# Patient Record
Sex: Male | Born: 1995 | Hispanic: Yes | Marital: Single | Smoking: Former smoker
Health system: Southern US, Community
[De-identification: ages and names within clinical notes are randomized; demographics above are authoritative.]

## PROBLEM LIST (undated history)

## (undated) DIAGNOSIS — K219 Gastro-esophageal reflux disease without esophagitis: Secondary | ICD-10-CM

## (undated) DIAGNOSIS — F419 Anxiety disorder, unspecified: Secondary | ICD-10-CM

## (undated) DIAGNOSIS — I10 Essential (primary) hypertension: Secondary | ICD-10-CM

## (undated) DIAGNOSIS — Z789 Other specified health status: Secondary | ICD-10-CM

## (undated) HISTORY — PX: ANTERIOR CRUCIATE LIGAMENT REPAIR: SHX115

## (undated) HISTORY — PX: TYMPANOSTOMY TUBE PLACEMENT: SHX32

## (undated) HISTORY — PX: CHOLECYSTECTOMY: SHX55

## (undated) HISTORY — PX: ADENOIDECTOMY: SUR15

## (undated) HISTORY — PX: TONSILLECTOMY: SUR1361

---

## 2019-12-30 ENCOUNTER — Other Ambulatory Visit: Payer: Self-pay

## 2019-12-30 ENCOUNTER — Emergency Department (HOSPITAL_BASED_OUTPATIENT_CLINIC_OR_DEPARTMENT_OTHER)
Admission: EM | Admit: 2019-12-30 | Discharge: 2019-12-30 | Disposition: A | Discharge status: Home / Self Care | Payer: Self-pay | Attending: Emergency Medicine | Admitting: Emergency Medicine

## 2019-12-30 ENCOUNTER — Encounter (HOSPITAL_BASED_OUTPATIENT_CLINIC_OR_DEPARTMENT_OTHER): Payer: Self-pay

## 2019-12-30 DIAGNOSIS — F1590 Other stimulant use, unspecified, uncomplicated: Secondary | ICD-10-CM | POA: Insufficient documentation

## 2019-12-30 DIAGNOSIS — Z87891 Personal history of nicotine dependence: Secondary | ICD-10-CM | POA: Insufficient documentation

## 2019-12-30 DIAGNOSIS — R1013 Epigastric pain: Secondary | ICD-10-CM | POA: Insufficient documentation

## 2019-12-30 LAB — CBC
HCT: 45.5 % (ref 39.0–52.0)
Hemoglobin: 15.3 g/dL (ref 13.0–17.0)
MCH: 28.7 pg (ref 26.0–34.0)
MCHC: 33.6 g/dL (ref 30.0–36.0)
MCV: 85.4 fL (ref 80.0–100.0)
Platelets: 228 10*3/uL (ref 150–400)
RBC: 5.33 MIL/uL (ref 4.22–5.81)
RDW: 12.7 % (ref 11.5–15.5)
WBC: 9.3 10*3/uL (ref 4.0–10.5)
nRBC: 0 % (ref 0.0–0.2)

## 2019-12-30 LAB — COMPREHENSIVE METABOLIC PANEL
ALT: 101 U/L — ABNORMAL HIGH (ref 0–44)
AST: 44 U/L — ABNORMAL HIGH (ref 15–41)
Albumin: 4.4 g/dL (ref 3.5–5.0)
Alkaline Phosphatase: 63 U/L (ref 38–126)
Anion gap: 9 (ref 5–15)
BUN: 13 mg/dL (ref 6–20)
CO2: 25 mmol/L (ref 22–32)
Calcium: 9.6 mg/dL (ref 8.9–10.3)
Chloride: 104 mmol/L (ref 98–111)
Creatinine, Ser: 0.81 mg/dL (ref 0.61–1.24)
GFR calc Af Amer: >60 mL/min (ref >60)
GFR calc non Af Amer: >60 mL/min (ref >60)
Glucose, Bld: 122 mg/dL — ABNORMAL HIGH (ref 70–99)
Potassium: 4.3 mmol/L (ref 3.5–5.1)
Sodium: 138 mmol/L (ref 135–145)
Total Bilirubin: 0.5 mg/dL (ref 0.3–1.2)
Total Protein: 7.1 g/dL (ref 6.5–8.1)

## 2019-12-30 LAB — LIPASE, BLOOD: Lipase: 23 U/L (ref 11–51)

## 2019-12-30 MED ORDER — FAMOTIDINE 20 MG PO TABS
20.0000 mg | ORAL_TABLET | Freq: Once | ORAL | Status: AC
  Administered 2019-12-30: 20 mg via ORAL
  Filled 2019-12-30: qty 1

## 2019-12-30 MED ORDER — ALUM & MAG HYDROXIDE-SIMETH 200-200-20 MG/5ML PO SUSP
30.0000 mL | Freq: Once | ORAL | Status: AC
  Administered 2019-12-30: 30 mL via ORAL
  Filled 2019-12-30: qty 30

## 2019-12-30 NOTE — ED Provider Notes (Signed)
[facility] EMERGENCY DEPARTMENT Provider Note   CSN: [id number] Arrival date & time: 12/30/19  0809     History Chief Complaint  Patient presents with  . Abdominal Pain    Leonard Clark is a 24 y.o. male.  Patient c/o epigastric pain for the past several months/years. Epigastric area, dull, moderate, non radiating. States pain is there whenever he wakes up in morning at normal time, states if he sleeps in late, he typically wont have them pain that day. Pain only in AM, for first 1-2 hours after awakening. No other specific exacerbating or alleviating factors. +reflux/heartburn. Denies radiation of pain or back pain. No fever or chills. Normal appetite. No wt loss. No hx pud, or pancreatitis. States remote hx cholecystectomy in his early teens, 'due to gb working only 15%'. No other abd surgery.   The history is provided by the patient and a parent.  Abdominal Pain Associated symptoms: no chest pain, no constipation, no cough, no diarrhea, no fever, no shortness of breath and no sore throat        History reviewed. No pertinent past medical history.  There are no problems to display for this patient.   Past Surgical History:  Procedure Laterality Date  . ADENOIDECTOMY    . ANTERIOR CRUCIATE LIGAMENT REPAIR    . CHOLECYSTECTOMY    . TONSILLECTOMY    . TYMPANOSTOMY TUBE PLACEMENT         No family history on file.  Social History   Tobacco Use  . Smoking status: Former Games developer  . Smokeless tobacco: Never Used  Vaping Use  . Vaping Use: Never used  Substance Use Topics  . Alcohol use: Never  . Drug use: Yes    Comment: delta 8 (marijuana) vape pen    Home Medications Prior to Admission medications   Not on File    Allergies    Other  Review of Systems   Review of Systems  Constitutional: Negative for fever.  HENT: Negative for sore throat and trouble swallowing.   Eyes: Negative for redness.  Respiratory: Negative for cough and shortness of  breath.   Cardiovascular: Negative for chest pain.  Gastrointestinal: Positive for abdominal pain. Negative for constipation and diarrhea.  Genitourinary: Negative for flank pain.  Musculoskeletal: Negative for back pain and neck pain.  Skin: Negative for rash.  Neurological: Negative for headaches.  Hematological: Does not bruise/bleed easily.  Psychiatric/Behavioral: Negative for confusion.    Physical Exam Updated Vital Signs BP 137/90 (BP Location: Right Arm)   Pulse 79   Temp 98.1 F (36.7 C) (Oral)   Resp 20   Ht 1.854 m (6\' 1" )   Wt (!) 161.5 kg   SpO2 98%   BMI 46.97 kg/m   Physical Exam Vitals and nursing note reviewed.  Constitutional:      Appearance: Normal appearance. He is well-developed.  HENT:     Head: Atraumatic.     Nose: Nose normal.     Mouth/Throat:     Mouth: Mucous membranes are moist.     Pharynx: Oropharynx is clear.  Eyes:     General: No scleral icterus.    Conjunctiva/sclera: Conjunctivae normal.  Neck:     Trachea: No tracheal deviation.  Cardiovascular:     Rate and Rhythm: Normal rate and regular rhythm.     Pulses: Normal pulses.     Heart sounds: Normal heart sounds. No murmur heard.  No friction rub. No gallop.   Pulmonary:  Effort: Pulmonary effort is normal. No accessory muscle usage or respiratory distress.     Breath sounds: Normal breath sounds.  Abdominal:     General: Bowel sounds are normal. There is no distension.     Palpations: Abdomen is soft. There is no mass.     Tenderness: There is no abdominal tenderness. There is no guarding or rebound.     Hernia: No hernia is present.     Comments: Obese.  Genitourinary:    Comments: No cva tenderness. Musculoskeletal:        General: No swelling or tenderness.     Cervical back: Normal range of motion and neck supple. No rigidity.  Skin:    General: Skin is warm and dry.     Findings: No rash.  Neurological:     Mental Status: He is alert.     Comments: Alert,  speech clear.   Psychiatric:        Mood and Affect: Mood normal.     ED Results / Procedures / Treatments   Labs (all labs ordered are listed, but only abnormal results are displayed) Results for orders placed or performed during the hospital encounter of 12/30/19  CBC  Result Value Ref Range   WBC 9.3 4.0 - 10.5 K/uL   RBC 5.33 4.22 - 5.81 MIL/uL   Hemoglobin 15.3 13.0 - 17.0 g/dL   HCT 85.6 39 - 52 %   MCV 85.4 80.0 - 100.0 fL   MCH 28.7 26.0 - 34.0 pg   MCHC 33.6 30.0 - 36.0 g/dL   RDW 31.4 97.0 - 26.3 %   Platelets 228 150 - 400 K/uL   nRBC 0.0 0.0 - 0.2 %  Comprehensive metabolic panel  Result Value Ref Range   Sodium 138 135 - 145 mmol/L   Potassium 4.3 3.5 - 5.1 mmol/L   Chloride 104 98 - 111 mmol/L   CO2 25 22 - 32 mmol/L   Glucose, Bld 122 (H) 70 - 99 mg/dL   BUN 13 6 - 20 mg/dL   Creatinine, Ser 7.85 0.61 - 1.24 mg/dL   Calcium 9.6 8.9 - 88.5 mg/dL   Total Protein 7.1 6.5 - 8.1 g/dL   Albumin 4.4 3.5 - 5.0 g/dL   AST 44 (H) 15 - 41 U/L   ALT 101 (H) 0 - 44 U/L   Alkaline Phosphatase 63 38 - 126 U/L   Total Bilirubin 0.5 0.3 - 1.2 mg/dL   GFR calc non Af Amer >60 >60 mL/min   GFR calc Af Amer >60 >60 mL/min   Anion gap 9 5 - 15  Lipase, blood  Result Value Ref Range   Lipase 23 11 - 51 U/L    EKG None  Radiology No results found.  Procedures Procedures (including critical care time)  Medications Ordered in ED Medications  famotidine (PEPCID) tablet 20 mg (has no administration in time range)  alum & mag hydroxide-simeth (MAALOX/MYLANTA) 200-200-20 MG/5ML suspension 30 mL (has no administration in time range)    ED Course  I have reviewed the triage vital signs and the nursing notes.  Pertinent labs & imaging results that were available during my care of the patient were reviewed by me and considered in my medical decision making (see chart for details).    MDM Rules/Calculators/A&P                          Labs sent. pepcid po, maalox  po.  Reviewed nursing notes and prior charts for additional history.   Labs reviewed/interpreted by me - lipase normal. Wbc normal. Sl elev ast/alt.  h pylori, send out lab, will remain pending.  Recheck abd soft nt.  Pt with symptoms for prolonged period without acute or abrupt change today or this week. Rec gi f/u.  Return precautions provided.    Final Clinical Impression(s) / ED Diagnoses Final diagnoses:  None    Rx / DC Orders ED Discharge Orders    None       Cathren Laine, MD 12/30/19 1104

## 2019-12-30 NOTE — ED Triage Notes (Signed)
Pt presents with abd pain and vomiting that is present in the AM and is better in the afternoon. Symptoms x 5 years. Today pt reports he had coffee ground emesis.

## 2019-12-30 NOTE — Discharge Instructions (Addendum)
It was our pleasure to provide your ER care today - we hope that you feel better.  Take protonix (acid blocker medication). You may also try pepcid or maalox for symptom relief.  Follow up with gi doctor in the next 1-2 weeks -  call office to arrange appointment.   From today's labs, a couple of your liver function tests were slightly elevated (AST 44, ALT 101) - follow up with your doctor.   Return to ER if worse, new symptoms, fevers, worsening or severe pain, persistent vomiting, or other concern.

## 2019-12-31 LAB — H. PYLORI ANTIBODY, IGG: H Pylori IgG: 0.16 Index Value (ref 0.00–0.79)

## 2020-01-26 ENCOUNTER — Other Ambulatory Visit: Payer: Self-pay

## 2020-01-26 ENCOUNTER — Encounter (HOSPITAL_BASED_OUTPATIENT_CLINIC_OR_DEPARTMENT_OTHER): Payer: Self-pay

## 2020-01-26 DIAGNOSIS — Z87891 Personal history of nicotine dependence: Secondary | ICD-10-CM | POA: Insufficient documentation

## 2020-01-26 DIAGNOSIS — Z20822 Contact with and (suspected) exposure to covid-19: Secondary | ICD-10-CM | POA: Insufficient documentation

## 2020-01-26 DIAGNOSIS — R1110 Vomiting, unspecified: Secondary | ICD-10-CM | POA: Insufficient documentation

## 2020-01-26 DIAGNOSIS — R5383 Other fatigue: Secondary | ICD-10-CM | POA: Diagnosis present

## 2020-01-26 LAB — SARS CORONAVIRUS 2 BY RT PCR (HOSPITAL ORDER, PERFORMED IN ~~LOC~~ HOSPITAL LAB): SARS Coronavirus 2: NEGATIVE

## 2020-01-26 NOTE — ED Triage Notes (Signed)
Pt c/o flu like sx started 8/14-states he has to have a covid test to return to [facility] gait

## 2020-01-27 ENCOUNTER — Emergency Department (HOSPITAL_BASED_OUTPATIENT_CLINIC_OR_DEPARTMENT_OTHER)
Admission: EM | Admit: 2020-01-27 | Discharge: 2020-01-27 | Disposition: A | Discharge status: Home / Self Care | Payer: HRSA Program | Attending: Emergency Medicine | Admitting: Emergency Medicine

## 2020-01-27 DIAGNOSIS — R1110 Vomiting, unspecified: Secondary | ICD-10-CM

## 2020-01-27 DIAGNOSIS — Z20822 Contact with and (suspected) exposure to covid-19: Secondary | ICD-10-CM

## 2020-01-27 MED ORDER — ONDANSETRON 8 MG PO TBDP: 8.0000 mg | ORAL_TABLET | Freq: Three times a day (TID) | ORAL | 1 refills | Status: DC | PRN

## 2020-01-27 NOTE — ED Provider Notes (Signed)
[facility] Provider Note: Leonard Dell, MD, FACEP  CSN: [id number] MRN: [id number] ARRIVAL: 01/26/20 at 1913 ROOM: MHFT2/[facility]FT2   CHIEF COMPLAINT  Fatigue   HISTORY OF PRESENT ILLNESS  01/27/20 12:53 AM Leonard Clark is a 24 y.o. male with 4 days of nasal congestion, cough and fatigue.  Symptoms are mild to moderate.  He was told he needed a Covid test before he could return to work.  He has also been having abdominal pain, worse in the morning.  He was recently placed on Prilosec with significant improvement in the heartburn but he continues to have nausea and vomiting in the mornings.   History reviewed. No pertinent past medical history.  Past Surgical History:  Procedure Laterality Date  . ADENOIDECTOMY    . ANTERIOR CRUCIATE LIGAMENT REPAIR    . CHOLECYSTECTOMY    . TONSILLECTOMY    . TYMPANOSTOMY TUBE PLACEMENT      No family history on file.  Social History   Tobacco Use  . Smoking status: Former Games developer  . Smokeless tobacco: Never Used  Vaping Use  . Vaping Use: Every day  Substance Use Topics  . Alcohol use: Never  . Drug use: Not Currently    Prior to Admission medications   Medication Sig Start Date End Date Taking? Authorizing Provider  ondansetron (ZOFRAN ODT) 8 MG disintegrating tablet Take 1 tablet (8 mg total) by mouth every 8 (eight) hours as needed for nausea or vomiting. 01/27/20   Joseth Weigel, MD    Allergies Other   REVIEW OF SYSTEMS  Negative except as noted here or in the History of Present Illness.   PHYSICAL EXAMINATION  Initial Vital Signs Blood pressure (!) 124/96, pulse 75, temperature 98.5 F (36.9 C), temperature source Oral, resp. rate 14, height 6\' 1"  (1.854 m), weight (!) 162.4 kg, SpO2 99 %.  Examination General: Well-developed, well-nourished male in no acute distress; appearance consistent with age of record HENT: normocephalic; atraumatic; nasal congestion Eyes: pupils equal, round and reactive to  light; extraocular muscles intact Neck: supple Heart: regular rate and rhythm Lungs: clear to auscultation bilaterally Abdomen: soft; nondistended; nontender; bowel sounds present Extremities: No deformity; full range of motion Neurologic: Awake, alert and oriented; motor function intact in all extremities and symmetric; no facial droop Skin: Warm and dry Psychiatric: Normal mood and affect   RESULTS  Summary of this visit's results, reviewed and interpreted by myself:   EKG Interpretation  Date/Time:    Ventricular Rate:    PR Interval:    QRS Duration:   QT Interval:    QTC Calculation:   R Axis:     Text Interpretation:        Laboratory Studies: Results for orders placed or performed during the hospital encounter of 01/27/20 (from the past 24 hour(s))  SARS Coronavirus 2 by RT PCR (hospital order, performed in [facility] hospital lab) Nasopharyngeal Nasopharyngeal Swab     Status: None   Collection Time: 01/26/20  7:42 PM   Specimen: Nasopharyngeal Swab  Result Value Ref Range   SARS Coronavirus 2 NEGATIVE NEGATIVE   Imaging Studies: No results found.  ED COURSE and MDM  Nursing notes, initial and subsequent vitals signs, including pulse oximetry, reviewed and interpreted by myself.  Vitals:   01/26/20 1938 01/26/20 2240  BP: 131/87 (!) 124/96  Pulse: 80 75  Resp: 20 14  Temp: 98.5 F (36.9 C)   TempSrc: Oral   SpO2: 99% 99%  Weight: (!) 162.4  kg   Height: 6\' 1"  (1.854 m)    Medications - No data to display  We will add Zofran to his regimen.  As noted above he is had a positive response to the Prilosec.  He has follow-up pending with gastroenterology.  Covid is negative.  PROCEDURES  Procedures   ED DIAGNOSES     ICD-10-CM   1. COVID-19 virus not detected  Z03.818   2. Recurrent vomiting  R11.10        Najae Rathert, , MD 01/27/20 [phone]

## 2020-05-01 ENCOUNTER — Emergency Department (HOSPITAL_BASED_OUTPATIENT_CLINIC_OR_DEPARTMENT_OTHER)
Admission: EM | Admit: 2020-05-01 | Discharge: 2020-05-01 | Disposition: A | Discharge status: Home / Self Care | Payer: Medicaid Other | Attending: Emergency Medicine | Admitting: Emergency Medicine

## 2020-05-01 ENCOUNTER — Other Ambulatory Visit: Payer: Self-pay

## 2020-05-01 ENCOUNTER — Encounter (HOSPITAL_BASED_OUTPATIENT_CLINIC_OR_DEPARTMENT_OTHER): Payer: Self-pay | Admitting: Emergency Medicine

## 2020-05-01 DIAGNOSIS — Y9389 Activity, other specified: Secondary | ICD-10-CM | POA: Insufficient documentation

## 2020-05-01 DIAGNOSIS — W268XXA Contact with other sharp object(s), not elsewhere classified, initial encounter: Secondary | ICD-10-CM | POA: Insufficient documentation

## 2020-05-01 DIAGNOSIS — Z23 Encounter for immunization: Secondary | ICD-10-CM | POA: Insufficient documentation

## 2020-05-01 DIAGNOSIS — W228XXA Striking against or struck by other objects, initial encounter: Secondary | ICD-10-CM | POA: Insufficient documentation

## 2020-05-01 DIAGNOSIS — S01112A Laceration without foreign body of left eyelid and periocular area, initial encounter: Secondary | ICD-10-CM | POA: Insufficient documentation

## 2020-05-01 MED ORDER — LIDOCAINE HCL 2 % IJ SOLN
10.0000 mL | Freq: Once | INTRAMUSCULAR | Status: AC
  Administered 2020-05-01: 200 mg
  Filled 2020-05-01: qty 20

## 2020-05-01 MED ORDER — TETANUS-DIPHTH-ACELL PERTUSSIS 5-2.5-18.5 LF-MCG/0.5 IM SUSY
0.5000 mL | PREFILLED_SYRINGE | Freq: Once | INTRAMUSCULAR | Status: AC
  Administered 2020-05-01: 0.5 mL via INTRAMUSCULAR
  Filled 2020-05-01: qty 0.5

## 2020-05-01 NOTE — ED Provider Notes (Signed)
[facility] EMERGENCY DEPARTMENT Provider Note   CSN: [id number] Arrival date & time: 05/01/20  1756     History Chief Complaint  Patient presents with  . Facial Laceration    Lashon Hillier is a 24 y.o. male here presenting with left eyebrow laceration.  Patient was mopping and moving things and slipped and hit his head on the corner of a speaker box.  He noticed bleeding and noticed a laceration of the left eyebrow.  Patient states that his tetanus is not up-to-date.  Denies any vomiting or headaches.  The history is provided by the patient.       History reviewed. No pertinent past medical history.  There are no problems to display for this patient.   Past Surgical History:  Procedure Laterality Date  . ADENOIDECTOMY    . ANTERIOR CRUCIATE LIGAMENT REPAIR    . CHOLECYSTECTOMY    . TONSILLECTOMY    . TYMPANOSTOMY TUBE PLACEMENT         No family history on file.  Social History   Tobacco Use  . Smoking status: Former Games developer  . Smokeless tobacco: Never Used  Vaping Use  . Vaping Use: Every day  Substance Use Topics  . Alcohol use: Never  . Drug use: Not Currently    Home Medications Prior to Admission medications   Medication Sig Start Date End Date Taking? Authorizing Provider  ondansetron (ZOFRAN ODT) 8 MG disintegrating tablet Take 1 tablet (8 mg total) by mouth every 8 (eight) hours as needed for nausea or vomiting. 01/27/20   Molpus, Jonny Ruiz, MD    Allergies    Other  Review of Systems   Review of Systems  Skin: Positive for wound.  All other systems reviewed and are negative.   Physical Exam Updated Vital Signs BP 133/87 (BP Location: Left Arm)   Pulse 97   Temp 98.5 F (36.9 C) (Oral)   Resp 18   Ht 6\' 1"  (1.854 m)   Wt (!) 157.9 kg   SpO2 99%   BMI 45.91 kg/m   Physical Exam Vitals and nursing note reviewed.  Constitutional:      Appearance: Normal appearance.  HENT:     Head: Normocephalic.     Comments: 3 cm  laceration involving the left eyebrow.  Is well approximated and bleeding is controlled    Nose: Nose normal.     Mouth/Throat:     Mouth: Mucous membranes are moist.  Eyes:     Extraocular Movements: Extraocular movements intact.     Pupils: Pupils are equal, round, and reactive to light.  Cardiovascular:     Rate and Rhythm: Normal rate.     Pulses: Normal pulses.  Pulmonary:     Effort: Pulmonary effort is normal.  Abdominal:     General: Abdomen is flat.  Musculoskeletal:        General: Normal range of motion.     Cervical back: Normal range of motion and neck supple.  Skin:    General: Skin is warm.     Capillary Refill: Capillary refill takes less than 2 seconds.  Neurological:     General: No focal deficit present.     Mental Status: He is alert and oriented to person, place, and time.  Psychiatric:        Mood and Affect: Mood normal.        Behavior: Behavior normal.     ED Results / Procedures / Treatments   Labs (all labs ordered  are listed, but only abnormal results are displayed) Labs Reviewed - No data to display  EKG None  Radiology No results found.  Procedures Procedures (including critical care time)  LACERATION REPAIR Performed by: Richardean Canal Authorized by: Richardean Canal Consent: Verbal consent obtained. Risks and benefits: risks, benefits and alternatives were discussed Consent given by: patient Patient identity confirmed: provided demographic data Prepped and Draped in normal sterile fashion Wound explored  Laceration Location: L eyebrow  Laceration Length: 3 cm  No Foreign Bodies seen or palpated  Anesthesia: local infiltration  Local anesthetic: lidocaine 2% no epinephrine  Anesthetic total: 8 ml  Irrigation method: syringe Amount of cleaning: standard  Skin closure: 5-0 ethilon  Number of sutures: 3  Technique: simple interrupted   Patient tolerance: Patient tolerated the procedure well with no immediate  complications.  Medications Ordered in ED Medications  lidocaine (XYLOCAINE) 2 % (with pres) injection 200 mg (200 mg Infiltration Given by Other 05/01/20 1829)  Tdap (BOOSTRIX) injection 0.5 mL (0.5 mLs Intramuscular Given 05/01/20 1830)    ED Course  I have reviewed the triage vital signs and the nursing notes.  Pertinent labs & imaging results that were available during my care of the patient were reviewed by me and considered in my medical decision making (see chart for details).    MDM Rules/Calculators/A&P                         Oaklen Thiam is a 24 y.o. male here with left eyebrow laceration.  He has a 3 cm laceration that is sutured.  He has nonfocal neuro exam and no headaches and no need for CT head right now. Told him that he needs suture removal in a week.   Final Clinical Impression(s) / ED Diagnoses Final diagnoses:  Laceration of left eyebrow, initial encounter    Rx / DC Orders ED Discharge Orders    None       Charlynne Pander, MD 05/01/20 1842

## 2020-05-01 NOTE — ED Triage Notes (Signed)
Reports he was mopping and moving his room around.  Slipped up hit his head on the corner of a speaker box.  Laceration noted to left eyebrow.  Bleeding controlled currently.

## 2020-05-01 NOTE — ED Notes (Signed)
ED Provider at bedside. 

## 2020-05-01 NOTE — Discharge Instructions (Signed)
You have a laceration and 3 stitches were placed.  Keep the wound clean and dry  Return in a week for suture removal.  Return sooner if you have purulent discharge, uncontrolled bleeding, fever.

## 2020-08-23 ENCOUNTER — Emergency Department (HOSPITAL_BASED_OUTPATIENT_CLINIC_OR_DEPARTMENT_OTHER): Payer: Self-pay

## 2020-08-23 ENCOUNTER — Emergency Department (HOSPITAL_BASED_OUTPATIENT_CLINIC_OR_DEPARTMENT_OTHER)
Admission: EM | Admit: 2020-08-23 | Discharge: 2020-08-23 | Disposition: A | Discharge status: Home / Self Care | Payer: Self-pay | Attending: Emergency Medicine | Admitting: Emergency Medicine

## 2020-08-23 ENCOUNTER — Encounter (HOSPITAL_BASED_OUTPATIENT_CLINIC_OR_DEPARTMENT_OTHER): Payer: Self-pay

## 2020-08-23 ENCOUNTER — Other Ambulatory Visit: Payer: Self-pay

## 2020-08-23 DIAGNOSIS — Z87891 Personal history of nicotine dependence: Secondary | ICD-10-CM | POA: Insufficient documentation

## 2020-08-23 DIAGNOSIS — R002 Palpitations: Secondary | ICD-10-CM | POA: Insufficient documentation

## 2020-08-23 DIAGNOSIS — R079 Chest pain, unspecified: Secondary | ICD-10-CM | POA: Insufficient documentation

## 2020-08-23 LAB — HEPATIC FUNCTION PANEL
ALT: 79 U/L — ABNORMAL HIGH (ref 0–44)
AST: 36 U/L (ref 15–41)
Albumin: 4.9 g/dL (ref 3.5–5.0)
Alkaline Phosphatase: 72 U/L (ref 38–126)
Bilirubin, Direct: 0.2 mg/dL (ref 0.0–0.2)
Indirect Bilirubin: 1.1 mg/dL — ABNORMAL HIGH (ref 0.3–0.9)
Total Bilirubin: 1.3 mg/dL — ABNORMAL HIGH (ref 0.3–1.2)
Total Protein: 8.6 g/dL — ABNORMAL HIGH (ref 6.5–8.1)

## 2020-08-23 LAB — BASIC METABOLIC PANEL
Anion gap: 12 (ref 5–15)
BUN: 12 mg/dL (ref 6–20)
CO2: 22 mmol/L (ref 22–32)
Calcium: 9.5 mg/dL (ref 8.9–10.3)
Chloride: 103 mmol/L (ref 98–111)
Creatinine, Ser: 0.97 mg/dL (ref 0.61–1.24)
GFR, Estimated: >60 mL/min (ref >60)
Glucose, Bld: 95 mg/dL (ref 70–99)
Potassium: 3.6 mmol/L (ref 3.5–5.1)
Sodium: 137 mmol/L (ref 135–145)

## 2020-08-23 LAB — CBC
HCT: 49 % (ref 39.0–52.0)
Hemoglobin: 17.2 g/dL — ABNORMAL HIGH (ref 13.0–17.0)
MCH: 29 pg (ref 26.0–34.0)
MCHC: 35.1 g/dL (ref 30.0–36.0)
MCV: 82.5 fL (ref 80.0–100.0)
Platelets: 317 10*3/uL (ref 150–400)
RBC: 5.94 MIL/uL — ABNORMAL HIGH (ref 4.22–5.81)
RDW: 12.8 % (ref 11.5–15.5)
WBC: 9.6 10*3/uL (ref 4.0–10.5)
nRBC: 0 % (ref 0.0–0.2)

## 2020-08-23 LAB — D-DIMER, QUANTITATIVE: D-Dimer, Quant: 0.36 ug/mL-FEU (ref 0.00–0.50)

## 2020-08-23 LAB — TROPONIN I (HIGH SENSITIVITY)
Troponin I (High Sensitivity): 2 ng/L (ref <18)
Troponin I (High Sensitivity): 2 ng/L (ref <18)

## 2020-08-23 LAB — LIPASE, BLOOD: Lipase: 28 U/L (ref 11–51)

## 2020-08-23 MED ORDER — HYDROXYZINE HCL 25 MG PO TABS
25.0000 mg | ORAL_TABLET | Freq: Four times a day (QID) | ORAL | 0 refills | Status: DC
Start: 2020-08-23 — End: 2020-09-30

## 2020-08-23 MED ORDER — LORAZEPAM 1 MG PO TABS
1.0000 mg | ORAL_TABLET | Freq: Once | ORAL | Status: AC
  Administered 2020-08-23: 1 mg via ORAL
  Filled 2020-08-23: qty 1

## 2020-08-23 NOTE — ED Notes (Signed)
Nowhere to obtain ekg at the moment

## 2020-08-23 NOTE — ED Triage Notes (Addendum)
Pt c/o intermittent feeling "my chest drop" x 3-4 days-denies CP at present-pt seen at [facility] ED yesterday for same-states "I think they said it was my anxiety"-NAD-steady gait

## 2020-08-23 NOTE — Discharge Instructions (Signed)
Follow-up with primary care provider.  If you do not have one I have listed on your discharge paperwork.  Call to schedule appointment.  You may need a Holter monitor to assess if you are having any arrhythmias while you are having the symptoms.  Your symptoms could certainly be related to some anxiety.  I have written you for a medication called hydroxyzine.  Take as needed.  Return for any worsening symptoms.

## 2020-08-23 NOTE — ED Provider Notes (Signed)
[facility] EMERGENCY DEPARTMENT Provider Note   CSN: [id number] Arrival date & time: 08/23/20  2006    History Chief Complaint  Patient presents with  . Chest Pain    Leonard BuffRicardo Delpilar is a 25 y.o. male with past medical history significant for cholecystectomy who presents for evaluation of chest pain.  Has had intermittent chest pain over the last year.  States he frequently gets heart palpitations.  No history of EtOH use, chronic NSAID use, illicit substance use or caffeine use.  States her last 3 to 4 days he has symptoms where he feels like his chest "drops."  States he intermittently has pain throughout his chest which will last for a few seconds and then resolved.  States his get some anxiety.  He was seen yesterday at [facility] emergency department.  Subsequently discharged home and diagnosed with anxiety.  He returns today due to continued symptoms.  No unilateral leg swelling, redness or warmth.  No prior history of PE or DVT.  Denies any prior history of SI, HI, AVH.  He has no current pain.  Denies additional aggravating or alleviating factors  History obtained from patient and past medical records.  No interpreter used.  HPI     History reviewed. No pertinent past medical history.  There are no problems to display for this patient.   Past Surgical History:  Procedure Laterality Date  . ADENOIDECTOMY    . ANTERIOR CRUCIATE LIGAMENT REPAIR    . CHOLECYSTECTOMY    . TONSILLECTOMY    . TYMPANOSTOMY TUBE PLACEMENT         No family history on file.  Social History   Tobacco Use  . Smoking status: Former Games developermoker  . Smokeless tobacco: Never Used  Vaping Use  . Vaping Use: Former  Substance Use Topics  . Alcohol use: Never  . Drug use: Yes    Types: Marijuana    Home Medications Prior to Admission medications   Medication Sig Start Date End Date Taking? Authorizing Provider  hydrOXYzine (ATARAX/VISTARIL) 25 MG tablet Take 1 tablet (25 mg  total) by mouth every 6 (six) hours. 08/23/20  Yes Laveda Demedeiros A, PA-C  ondansetron (ZOFRAN ODT) 8 MG disintegrating tablet Take 1 tablet (8 mg total) by mouth every 8 (eight) hours as needed for nausea or vomiting. 01/27/20   Molpus, Jonny RuizJohn, MD    Allergies    Red dye and Other  Review of Systems   Review of Systems  Constitutional: Negative.   HENT: Negative.   Respiratory: Negative.   Cardiovascular: Positive for chest pain (None currently) and palpitations. Negative for leg swelling.  Gastrointestinal: Negative.   Genitourinary: Negative.   Musculoskeletal: Negative.   Skin: Negative.   Neurological: Negative.   Psychiatric/Behavioral: The patient is nervous/anxious.   All other systems reviewed and are negative.   Physical Exam Updated Vital Signs BP (!) 143/89 (BP Location: Right Arm)   Pulse 80   Temp 98.6 F (37 C) (Oral)   Resp 20   Ht 6\' 1"  (1.854 m)   Wt (!) 158.3 kg   SpO2 100%   BMI 46.04 kg/m   Physical Exam Vitals and nursing note reviewed.  Constitutional:      General: He is not in acute distress.    Appearance: He is not ill-appearing, toxic-appearing or diaphoretic.  HENT:     Head: Normocephalic and atraumatic.     Jaw: There is normal jaw occlusion.     Nose: Nose normal.  Eyes:     Extraocular Movements: Extraocular movements intact.  Neck:     Vascular: No carotid bruit or JVD.     Trachea: Trachea and phonation normal.     Meningeal: Brudzinski's sign and Kernig's sign absent.  Cardiovascular:     Rate and Rhythm: Normal rate.     Pulses: Normal pulses.          Radial pulses are 2+ on the right side and 2+ on the left side.       Posterior tibial pulses are 2+ on the right side and 2+ on the left side.     Heart sounds: Normal heart sounds.  Pulmonary:     Effort: Pulmonary effort is normal.     Breath sounds: Normal breath sounds and air entry.     Comments: Speaks inClear to auscultation bilaterally sentences without difficulty.   Chest:     Chest wall: No deformity, swelling, tenderness, crepitus or edema.  Abdominal:     General: Bowel sounds are normal.     Palpations: Abdomen is soft.     Comments: Soft, nontender without rebound or guarding  Musculoskeletal:        General: Normal range of motion.     Cervical back: Full passive range of motion without pain, normal range of motion and neck supple.     Right lower leg: No tenderness. No edema.     Left lower leg: No tenderness. No edema.     Comments: No bony tenderness.  Compartments soft.  Moves all 4 extremities without difficulty.  Homans' sign negative  Feet:     Right foot:     Skin integrity: Skin integrity normal.     Left foot:     Skin integrity: Skin integrity normal.  Skin:    General: Skin is warm.     Capillary Refill: Capillary refill takes less than 2 seconds.     Comments: No edema, erythema or warmth.  No fluctuance or induration.  Neurological:     General: No focal deficit present.     Mental Status: He is alert and oriented to person, place, and time.     Comments: Cranial nerves II through grossly intact  Psychiatric:     Comments: No SI, HI, AVH.  Does appear anxious.     ED Results / Procedures / Treatments   Labs (all labs ordered are listed, but only abnormal results are displayed) Labs Reviewed  CBC - Abnormal; Notable for the following components:      Result Value   RBC 5.94 (*)    Hemoglobin 17.2 (*)    All other components within normal limits  HEPATIC FUNCTION PANEL - Abnormal; Notable for the following components:   Total Protein 8.6 (*)    ALT 79 (*)    Total Bilirubin 1.3 (*)    Indirect Bilirubin 1.1 (*)    All other components within normal limits  BASIC METABOLIC PANEL  LIPASE, BLOOD  D-DIMER, QUANTITATIVE  TROPONIN I (HIGH SENSITIVITY)  TROPONIN I (HIGH SENSITIVITY)    EKG EKG Interpretation  Date/Time:  Tuesday August 23 2020 20:19:50 EDT Ventricular Rate:  77 PR Interval:  144 QRS  Duration: 86 QT Interval:  352 QTC Calculation: 398 R Axis:   71 Text Interpretation: Sinus rhythm with marked sinus arrhythmia Otherwise normal ECG Confirmed by Virgina Norfolk [phone]) on 08/23/2020 8:35:20 PM   Radiology DG Chest 2 View  Result Date: 08/23/2020 CLINICAL DATA:  Chest pain EXAM: CHEST -  2 VIEW COMPARISON:  08/22/2020 FINDINGS: The heart size and mediastinal contours are within normal limits. Both lungs are clear. The visualized skeletal structures are unremarkable. IMPRESSION: No active cardiopulmonary disease. Electronically Signed   By: Kennith Center M.D.   On: 08/23/2020 21:11    Procedures Procedures   Medications Ordered in ED Medications  LORazepam (ATIVAN) tablet 1 mg (1 mg Oral Given 08/23/20 2238)    ED Course  I have reviewed the triage vital signs and the nursing notes.  Pertinent labs & imaging results that were available during my care of the patient were reviewed by me and considered in my medical decision making (see chart for details).  9 old presents for evaluation of intermittent chest pain.  Sounds is in line with palpitations over the last year.  He has no current chest pain.  No clinical evidence of DVT VT on exam.  He is PERC negative, Wells criteria low risk.  Does appear anxious.  He denies any SI, HI, AVH.  Was actually seen at [facility] yesterday for similar complaints, had thorough work-up and subsequently discharged.  Labs and imaging personally reviewed and interpreted:  CBC without leukocytosis Metabolic panel without electrolyte, renal or normality Metabolic function panel T bili 1.3, ALT 79, abdomen soft, nontender.  Negative Murphy sign.  Patient with prior cholecystectomy.  Low suspicion for choledocholithiasis, cholangitis. Delta troponin negative Lipase 28 D-dimer 0.36 EKG without ischemic changes  Heart score 1, obesity  Patient reassessed.  Symptoms likely multifactorial including some anxiety related however given  palpitations x1 year would likely benefit from PCP follow-up, possible Holter monitor to see if patient is having any PVCs or PACs.  He has not had any arrhythmias while he has been here in the emergency department.  Will DC home with close outpatient follow-up.  At this time I have low suspicion for ACS, PE, dissection, bacterial infectious process, pneumothorax, AAA.  Patient is to be discharged with recommendation to follow up with PCP in regards to today's hospital visit. Chest pain is not likely of cardiac or pulmonary etiology d/t presentation, PERC negative, VSS, no tracheal deviation, no JVD or new murmur, RRR, breath sounds equal bilaterally, EKG without acute abnormalities, negative troponin, and negative CXR. Pt has been advised to return to the ED if CP becomes exertional, associated with diaphoresis or nausea, radiates to left jaw/arm, worsens or becomes concerning in any way. Pt appears reliable for follow up and is agreeable to discharge.   The patient has been appropriately medically screened and/or stabilized in the ED. I have low suspicion for any other emergent medical condition which would require further screening, evaluation or treatment in the ED or require inpatient management.  Patient is hemodynamically stable and in no acute distress.  Patient able to ambulate in department prior to ED.  Evaluation does not show acute pathology that would require ongoing or additional emergent interventions while in the emergency department or further inpatient treatment.  I have discussed the diagnosis with the patient and answered all questions.  Pain is been managed while in the emergency department and patient has no further complaints prior to discharge.  Patient is comfortable with plan discussed in room and is stable for discharge at this time.  I have discussed strict return precautions for returning to the emergency department.  Patient was encouraged to follow-up with PCP/specialist refer  to at discharge. DG chest without infiltrates, cardiomegaly, pulmonary edema, pneumothorax    MDM Rules/Calculators/A&P  Final Clinical Impression(s) / ED Diagnoses Final diagnoses:  Nonspecific chest pain  Palpitations    Rx / DC Orders ED Discharge Orders         Ordered    hydrOXYzine (ATARAX/VISTARIL) 25 MG tablet  Every 6 hours        08/23/20 2301           Lynasia Meloche A, PA-C 08/23/20 2305    Virgina Norfolk, DO 08/23/20 2312

## 2020-08-27 ENCOUNTER — Emergency Department (HOSPITAL_BASED_OUTPATIENT_CLINIC_OR_DEPARTMENT_OTHER)
Admission: EM | Admit: 2020-08-27 | Discharge: 2020-08-27 | Disposition: A | Discharge status: Home / Self Care | Payer: Medicaid Other | Attending: Emergency Medicine | Admitting: Emergency Medicine

## 2020-08-27 ENCOUNTER — Encounter (HOSPITAL_BASED_OUTPATIENT_CLINIC_OR_DEPARTMENT_OTHER): Payer: Self-pay | Admitting: *Deleted

## 2020-08-27 ENCOUNTER — Other Ambulatory Visit: Payer: Self-pay

## 2020-08-27 DIAGNOSIS — K2970 Gastritis, unspecified, without bleeding: Secondary | ICD-10-CM | POA: Insufficient documentation

## 2020-08-27 DIAGNOSIS — Z20822 Contact with and (suspected) exposure to covid-19: Secondary | ICD-10-CM | POA: Insufficient documentation

## 2020-08-27 DIAGNOSIS — R197 Diarrhea, unspecified: Secondary | ICD-10-CM | POA: Insufficient documentation

## 2020-08-27 DIAGNOSIS — R059 Cough, unspecified: Secondary | ICD-10-CM | POA: Insufficient documentation

## 2020-08-27 DIAGNOSIS — R002 Palpitations: Secondary | ICD-10-CM | POA: Insufficient documentation

## 2020-08-27 DIAGNOSIS — Z87891 Personal history of nicotine dependence: Secondary | ICD-10-CM | POA: Insufficient documentation

## 2020-08-27 DIAGNOSIS — R0602 Shortness of breath: Secondary | ICD-10-CM | POA: Insufficient documentation

## 2020-08-27 DIAGNOSIS — R079 Chest pain, unspecified: Secondary | ICD-10-CM

## 2020-08-27 DIAGNOSIS — R5381 Other malaise: Secondary | ICD-10-CM | POA: Insufficient documentation

## 2020-08-27 LAB — CBC
HCT: 47.2 % (ref 39.0–52.0)
Hemoglobin: 16.7 g/dL (ref 13.0–17.0)
MCH: 28.8 pg (ref 26.0–34.0)
MCHC: 35.4 g/dL (ref 30.0–36.0)
MCV: 81.4 fL (ref 80.0–100.0)
Platelets: 283 10*3/uL (ref 150–400)
RBC: 5.8 MIL/uL (ref 4.22–5.81)
RDW: 12.5 % (ref 11.5–15.5)
WBC: 8.6 10*3/uL (ref 4.0–10.5)
nRBC: 0 % (ref 0.0–0.2)

## 2020-08-27 LAB — LIPASE, BLOOD: Lipase: 30 U/L (ref 11–51)

## 2020-08-27 LAB — BASIC METABOLIC PANEL
Anion gap: 11 (ref 5–15)
BUN: 12 mg/dL (ref 6–20)
CO2: 20 mmol/L — ABNORMAL LOW (ref 22–32)
Calcium: 9.8 mg/dL (ref 8.9–10.3)
Chloride: 106 mmol/L (ref 98–111)
Creatinine, Ser: 0.87 mg/dL (ref 0.61–1.24)
GFR, Estimated: >60 mL/min (ref >60)
Glucose, Bld: 100 mg/dL — ABNORMAL HIGH (ref 70–99)
Potassium: 3.3 mmol/L — ABNORMAL LOW (ref 3.5–5.1)
Sodium: 137 mmol/L (ref 135–145)

## 2020-08-27 LAB — HEPATIC FUNCTION PANEL
ALT: 77 U/L — ABNORMAL HIGH (ref 0–44)
AST: 34 U/L (ref 15–41)
Albumin: 4.6 g/dL (ref 3.5–5.0)
Alkaline Phosphatase: 66 U/L (ref 38–126)
Bilirubin, Direct: 0.1 mg/dL (ref 0.0–0.2)
Indirect Bilirubin: 0.9 mg/dL (ref 0.3–0.9)
Total Bilirubin: 1 mg/dL (ref 0.3–1.2)
Total Protein: 7.9 g/dL (ref 6.5–8.1)

## 2020-08-27 LAB — TROPONIN I (HIGH SENSITIVITY)
Troponin I (High Sensitivity): 2 ng/L (ref <18)
Troponin I (High Sensitivity): 2 ng/L (ref <18)

## 2020-08-27 MED ORDER — LACTATED RINGERS IV BOLUS
1000.0000 mL | Freq: Once | INTRAVENOUS | Status: AC
  Administered 2020-08-27: 1000 mL via INTRAVENOUS

## 2020-08-27 MED ORDER — ONDANSETRON HCL 4 MG PO TABS: 4.0000 mg | ORAL_TABLET | Freq: Four times a day (QID) | ORAL | 0 refills | Status: DC

## 2020-08-27 MED ORDER — FAMOTIDINE IN NACL 20-0.9 MG/50ML-% IV SOLN
20.0000 mg | Freq: Once | INTRAVENOUS | Status: AC
  Administered 2020-08-27: 20 mg via INTRAVENOUS
  Filled 2020-08-27: qty 50

## 2020-08-27 MED ORDER — SODIUM CHLORIDE 0.9 % IV SOLN
INTRAVENOUS | Status: DC | PRN
  Administered 2020-08-27: 500 mL via INTRAVENOUS

## 2020-08-27 MED ORDER — ONDANSETRON HCL 4 MG/2ML IJ SOLN
4.0000 mg | Freq: Once | INTRAMUSCULAR | Status: AC
  Administered 2020-08-27: 4 mg via INTRAVENOUS
  Filled 2020-08-27: qty 2

## 2020-08-27 MED ORDER — POTASSIUM CHLORIDE 10 MEQ/100ML IV SOLN
10.0000 meq | Freq: Once | INTRAVENOUS | Status: AC
  Administered 2020-08-27: 10 meq via INTRAVENOUS
  Filled 2020-08-27: qty 100

## 2020-08-27 NOTE — Discharge Instructions (Signed)
All your labs today look very reassuring.  I think what is causing your chest discomfort is most likely from your stomach.  It is important that you start your stomach medication again and eat small frequent meals.  Start with very bland foods like applesauce, Jell-O, toast and rice.  Avoid using any type of ibuprofen products or alcohol.  You can take Tylenol as needed.  Make sure you are staying hydrated.  Will be important for you to see a gastroenterologist in the future.  Your thyroid testing Covid test are still pending but should be resulted by tomorrow.

## 2020-08-27 NOTE — ED Notes (Signed)
EKG complete in triage

## 2020-08-27 NOTE — ED Triage Notes (Signed)
Pt brought in by EMS. Reports "stinging" chest pain and feels like his "heart drops" and he feels like he is going to pass out.  Reports he feels very tired and weak after these episodes. Seen in ED x 2 this week for same. He was given hydroxyzine for anxiety which "helps me calm down but I still feel like I'm going to pass out"

## 2020-08-27 NOTE — ED Notes (Signed)
Spoke with Okey Regal in lab to add on lipase and hepatic function panel

## 2020-08-27 NOTE — ED Provider Notes (Signed)
[facility] EMERGENCY DEPARTMENT Provider Note   CSN: [id number] Arrival date & time: 08/27/20  1326     History Chief Complaint  Patient presents with  . Chest Pain    Leonard Clark is a 25 y.o. male.  Patient presenting today with complaints of chest pain, abdominal pain, nausea, shortness of breath, poor oral intake and diarrhea.  Patient reports his symptoms started approximately 5 days ago and has not been getting any better.  Patient has had an occasional cough but denies any cold-like symptoms such as nasal congestion.  He has had no known fever but has been feeling cold.  He has been seen in the emergency room a few days ago for similar symptoms that reported that point he was sensing palpitations and just feeling worn out.  He was given hydroxyzine due to the concern for anxiety which he has been taking but he reports it makes him very tired and mellow but has not improved his symptoms.  He has had no vomiting but just reports that every time he eats he feels nauseated.  He does have a history of frequent GERD but this feels different.  Today he reported a stinging chest pain in the left side of his chest that he would feel in his neck and shoulder.  It is not made worse by activity and can happen spontaneously.  Usually lasts a few minutes.  No history of similar symptoms him prior to 4 to 5 days ago he felt normal.  He takes no over-the-counter medications.  2 days ago he stopped using any marijuana because he thought that might be the cause.  He does not use tobacco products and denies alcohol use.  He has no urinary symptoms at this time.  No history of asthma but he is status post cholecystectomy.  The history is provided by the patient and a parent.  Chest Pain      History reviewed. No pertinent past medical history.  There are no problems to display for this patient.   Past Surgical History:  Procedure Laterality Date  . ADENOIDECTOMY    . ANTERIOR CRUCIATE  LIGAMENT REPAIR    . CHOLECYSTECTOMY    . TONSILLECTOMY    . TYMPANOSTOMY TUBE PLACEMENT         No family history on file.  Social History   Tobacco Use  . Smoking status: Former Games developer  . Smokeless tobacco: Never Used  Vaping Use  . Vaping Use: Former  Substance Use Topics  . Alcohol use: Never  . Drug use: Not Currently    Types: Marijuana    Home Medications Prior to Admission medications   Medication Sig Start Date End Date Taking? Authorizing Provider  hydrOXYzine (ATARAX/VISTARIL) 25 MG tablet Take 1 tablet (25 mg total) by mouth every 6 (six) hours. 08/23/20   Henderly, Britni A, PA-C  ondansetron (ZOFRAN ODT) 8 MG disintegrating tablet Take 1 tablet (8 mg total) by mouth every 8 (eight) hours as needed for nausea or vomiting. 01/27/20   Molpus, John, MD    Allergies    Red dye and Other  Review of Systems   Review of Systems  Cardiovascular: Positive for chest pain.  All other systems reviewed and are negative.   Physical Exam Updated Vital Signs BP 129/85   Pulse (!) 58   Temp 98.5 F (36.9 C) (Oral)   Resp 20   Ht 6\' 1"  (1.854 m)   Wt (!) 156.9 kg   SpO2 100%  BMI 45.65 kg/m   Physical Exam Vitals and nursing note reviewed.  Constitutional:      General: He is not in acute distress.    Appearance: He is well-developed.  HENT:     Head: Normocephalic and atraumatic.     Right Ear: Tympanic membrane normal.     Left Ear: Tympanic membrane normal.     Nose: Nose normal.     Mouth/Throat:     Mouth: Mucous membranes are dry.     Pharynx: Oropharynx is clear.  Eyes:     Conjunctiva/sclera: Conjunctivae normal.     Pupils: Pupils are equal, round, and reactive to light.  Cardiovascular:     Rate and Rhythm: Normal rate and regular rhythm.     Pulses: Normal pulses.     Heart sounds: No murmur heard.   Pulmonary:     Effort: Pulmonary effort is normal. No respiratory distress.     Breath sounds: Normal breath sounds. No wheezing or rales.   Chest:     Chest wall: No tenderness.  Abdominal:     General: There is no distension.     Palpations: Abdomen is soft.     Tenderness: There is abdominal tenderness in the epigastric area and left upper quadrant. There is no right CVA tenderness, left CVA tenderness, guarding or rebound.  Musculoskeletal:        General: No tenderness. Normal range of motion.     Cervical back: Normal range of motion and neck supple.     Right lower leg: No edema.     Left lower leg: No edema.  Skin:    General: Skin is warm and dry.     Findings: No erythema or rash.  Neurological:     Mental Status: He is alert and oriented to person, place, and time. Mental status is at baseline.     Sensory: No sensory deficit.     Motor: No weakness.  Psychiatric:        Behavior: Behavior normal.     Comments: Drowsy and flat affect but otherwise appropriate     ED Results / Procedures / Treatments   Labs (all labs ordered are listed, but only abnormal results are displayed) Labs Reviewed  BASIC METABOLIC PANEL - Abnormal; Notable for the following components:      Result Value   Potassium 3.3 (*)    CO2 20 (*)    Glucose, Bld 100 (*)    All other components within normal limits  HEPATIC FUNCTION PANEL - Abnormal; Notable for the following components:   ALT 77 (*)    All other components within normal limits  SARS CORONAVIRUS 2 (TAT 6-24 HRS)  CBC  LIPASE, BLOOD  TSH  TROPONIN I (HIGH SENSITIVITY)  TROPONIN I (HIGH SENSITIVITY)    EKG None  ED ECG REPORT   Date: 08/29/2020  Rate: 75  Rhythm: normal sinus rhythm  QRS Axis: normal  Intervals: normal  ST/T Wave abnormalities: normal  Conduction Disutrbances:none  Narrative Interpretation:   Old EKG Reviewed: unchanged  I have personally reviewed the EKG tracing and agree with the computerized printout as noted.   Radiology No results found.  Procedures Procedures   Medications Ordered in ED Medications  0.9 %  sodium  chloride infusion (500 mLs Intravenous New Bag/Given 08/27/20 1612)  lactated ringers bolus 1,000 mL (1,000 mLs Intravenous New Bag/Given 08/27/20 1557)  ondansetron (ZOFRAN) injection 4 mg (4 mg Intravenous Given 08/27/20 1601)  famotidine (PEPCID) IVPB 20  mg premix (0 mg Intravenous Stopped 08/27/20 1642)  potassium chloride 10 mEq in 100 mL IVPB (10 mEq Intravenous New Bag/Given 08/27/20 1613)    ED Course  I have reviewed the triage vital signs and the nursing notes.  Pertinent labs & imaging results that were available during my care of the patient were reviewed by me and considered in my medical decision making (see chart for details).    MDM Rules/Calculators/A&P                          Patient is a 25 year old male presenting today with 4 to 5 days of general malaise, palpitations with some staying in his chest today, shortness of breath, diarrhea, nausea and poor appetite.  Patient reports the symptoms are persistent despite trying the hydroxyzine for anxiety.  Mom reports he has not been sleeping well he has been calling her reporting he just feels terrible and she is not sure what is wrong.  Patient does have some epigastric and left upper quadrant pain on exam but has prior history of cholecystectomy and today does not have elevated LFTs other than mild elevation of ALT at 77 which is improved from 5 days ago, total bilirubin or lipase to suggest blocked bile duct.  He does not have lower abdominal pain concerning for appendicitis or diverticulitis.  He denies any urinary symptoms and has no back pain to suggest renal stone.  He had a D-dimer done 4 days ago which was negative had a normal chest x-ray at that time and has a delta troponin of 2.  EKG does not show any evidence of pericarditis or myocarditis.  His symptoms are not suggestive of that.  Low suspicion for an acute cardiac issue or PE or dissection.  Patient does have borderline potassium of 3.3 today but no evidence of diabetes or  anion gap.  Concern that some of patient's symptoms may be viral in nature as well as exacerbation of his chronic GERD.  He was given IV fluids, Zofran and Pepcid.  Covid test is pending.  Also TSH is pending.  No evidence of ectopy while evaluating the patient while he was on the monitor.  TSH and covid neg.  Final Clinical Impression(s) / ED Diagnoses Final diagnoses:  Nonspecific chest pain  Gastritis without bleeding, unspecified chronicity, unspecified gastritis type    Rx / DC Orders ED Discharge Orders    None       Gwyneth Sprout, MD 08/29/20 0002

## 2020-08-27 NOTE — ED Notes (Signed)
Provided PO fluids and snacks per EDP request.

## 2020-08-27 NOTE — ED Triage Notes (Signed)
EMS reports pt transport from home for anxiety. Seen in ED x 2 this week for same. VS and CBG checked

## 2020-08-28 LAB — TSH: TSH: 1.56 u[IU]/mL (ref 0.350–4.500)

## 2020-08-28 LAB — SARS CORONAVIRUS 2 (TAT 6-24 HRS): SARS Coronavirus 2: NEGATIVE

## 2020-09-23 ENCOUNTER — Emergency Department (HOSPITAL_BASED_OUTPATIENT_CLINIC_OR_DEPARTMENT_OTHER)
Admission: EM | Admit: 2020-09-23 | Discharge: 2020-09-24 | Disposition: A | Discharge status: Home / Self Care | Payer: Medicaid Other | Attending: Emergency Medicine | Admitting: Emergency Medicine

## 2020-09-23 ENCOUNTER — Other Ambulatory Visit: Payer: Self-pay

## 2020-09-23 ENCOUNTER — Encounter (HOSPITAL_BASED_OUTPATIENT_CLINIC_OR_DEPARTMENT_OTHER): Payer: Self-pay | Admitting: *Deleted

## 2020-09-23 DIAGNOSIS — F411 Generalized anxiety disorder: Secondary | ICD-10-CM | POA: Insufficient documentation

## 2020-09-23 DIAGNOSIS — F1220 Cannabis dependence, uncomplicated: Secondary | ICD-10-CM | POA: Insufficient documentation

## 2020-09-23 DIAGNOSIS — F332 Major depressive disorder, recurrent severe without psychotic features: Secondary | ICD-10-CM | POA: Insufficient documentation

## 2020-09-23 DIAGNOSIS — Z20822 Contact with and (suspected) exposure to covid-19: Secondary | ICD-10-CM | POA: Insufficient documentation

## 2020-09-23 DIAGNOSIS — R45851 Suicidal ideations: Secondary | ICD-10-CM | POA: Insufficient documentation

## 2020-09-23 DIAGNOSIS — Z87891 Personal history of nicotine dependence: Secondary | ICD-10-CM | POA: Insufficient documentation

## 2020-09-23 DIAGNOSIS — R0789 Other chest pain: Secondary | ICD-10-CM | POA: Insufficient documentation

## 2020-09-23 LAB — CBC
HCT: 48.9 % (ref 39.0–52.0)
Hemoglobin: 17 g/dL (ref 13.0–17.0)
MCH: 28.3 pg (ref 26.0–34.0)
MCHC: 34.8 g/dL (ref 30.0–36.0)
MCV: 81.5 fL (ref 80.0–100.0)
Platelets: 308 10*3/uL (ref 150–400)
RBC: 6 MIL/uL — ABNORMAL HIGH (ref 4.22–5.81)
RDW: 12.6 % (ref 11.5–15.5)
WBC: 11.5 10*3/uL — ABNORMAL HIGH (ref 4.0–10.5)
nRBC: 0 % (ref 0.0–0.2)

## 2020-09-23 LAB — COMPREHENSIVE METABOLIC PANEL
ALT: 66 U/L — ABNORMAL HIGH (ref 0–44)
AST: 26 U/L (ref 15–41)
Albumin: 4.6 g/dL (ref 3.5–5.0)
Alkaline Phosphatase: 76 U/L (ref 38–126)
Anion gap: 11 (ref 5–15)
BUN: 11 mg/dL (ref 6–20)
CO2: 20 mmol/L — ABNORMAL LOW (ref 22–32)
Calcium: 9.1 mg/dL (ref 8.9–10.3)
Chloride: 103 mmol/L (ref 98–111)
Creatinine, Ser: 0.83 mg/dL (ref 0.61–1.24)
GFR, Estimated: >60 mL/min (ref >60)
Glucose, Bld: 110 mg/dL — ABNORMAL HIGH (ref 70–99)
Potassium: 3.2 mmol/L — ABNORMAL LOW (ref 3.5–5.1)
Sodium: 134 mmol/L — ABNORMAL LOW (ref 135–145)
Total Bilirubin: 1 mg/dL (ref 0.3–1.2)
Total Protein: 8.3 g/dL — ABNORMAL HIGH (ref 6.5–8.1)

## 2020-09-23 LAB — RESP PANEL BY RT-PCR (FLU A&B, COVID) ARPGX2
Influenza A by PCR: NEGATIVE
Influenza B by PCR: NEGATIVE
SARS Coronavirus 2 by RT PCR: NEGATIVE

## 2020-09-23 LAB — RAPID URINE DRUG SCREEN, HOSP PERFORMED
Amphetamines: NOT DETECTED
Barbiturates: NOT DETECTED
Benzodiazepines: NOT DETECTED
Cocaine: NOT DETECTED
Opiates: NOT DETECTED
Tetrahydrocannabinol: POSITIVE — AB

## 2020-09-23 LAB — TROPONIN I (HIGH SENSITIVITY): Troponin I (High Sensitivity): 3 ng/L (ref <18)

## 2020-09-23 LAB — ETHANOL: Alcohol, Ethyl (B): <10 mg/dL (ref <10)

## 2020-09-23 LAB — ACETAMINOPHEN LEVEL: Acetaminophen (Tylenol), Serum: 14 ug/mL (ref 10–30)

## 2020-09-23 LAB — SALICYLATE LEVEL: Salicylate Lvl: <7 mg/dL — ABNORMAL LOW (ref 7.0–30.0)
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
## 2020-09-23 NOTE — ED Notes (Signed)
TTS online for eval.

## 2020-09-23 NOTE — ED Provider Notes (Signed)
[facility] [place] [facility] Provider Note   CSN: [id number] Arrival date & time: 09/23/20  1854     History No chief complaint on file.   Leonard Clark is a 25 y.o. male with a past medical history of anxiety who presents today for evaluation of suicidal ideation. He was seen 2 days ago at [facility] for atypical chest pain, and it appears in the past 30 days he has had about 5 visits for nonspecific chest pain, all of which resulted in reassuring work-ups. He states that over the past few days he has been increasingly suicidal. He states that he has a plan to overdose on Tylenol.  He states that he was earlier holding a bottle of Tylenol however denies having taken any stating that it would make his mother sad. He endorses marijuana use however denies any other substances.  Does not drink alcohol regularly.  He states that he had auditory hallucinations last night and that he heard a dead family member telling him that death is everywhere and death is close. He reports intrusive thoughts about death.  He is considered moving to a right to die state.  He states that he called the police officers near where his sister, who lives out of state, is.  He does not currently talk with the sister however he called the police to see if they would encourage her to talk with their mother as he states he did not want her to be alone after he killed himself.  He denies HI or auditory hallucinations.  He does report that his chest pain is continuing since his evaluation and work-up 2 days ago.  No significant cardiac history.  He states that since he started taking hydroxyzine he has felt very tired and drained.  HPI     History reviewed. No pertinent past medical history.  There are no problems to display for this patient.   Past Surgical History:  Procedure Laterality Date  . ADENOIDECTOMY    . ANTERIOR CRUCIATE LIGAMENT REPAIR    . CHOLECYSTECTOMY    . TONSILLECTOMY     . TYMPANOSTOMY TUBE PLACEMENT         No family history on file.  Social History   Tobacco Use  . Smoking status: Former Games developer  . Smokeless tobacco: Never Used  Vaping Use  . Vaping Use: Former  Substance Use Topics  . Alcohol use: Never  . Drug use: Not Currently    Types: Marijuana    Home Medications Prior to Admission medications   Medication Sig Start Date End Date Taking? Authorizing Provider  escitalopram (LEXAPRO) 10 MG tablet Take 10 mg by mouth daily.   Yes [provider]  hydrOXYzine (ATARAX/VISTARIL) 25 MG tablet Take by mouth. 09/21/20 10/21/20 Yes [provider]  hydrOXYzine (ATARAX/VISTARIL) 25 MG tablet Take 1 tablet (25 mg total) by mouth every 6 (six) hours. 08/23/20   Henderly, Britni A, PA-C  ondansetron (ZOFRAN ODT) 8 MG disintegrating tablet Take 1 tablet (8 mg total) by mouth every 8 (eight) hours as needed for nausea or vomiting. 01/27/20   Molpus, John, MD  ondansetron (ZOFRAN) 4 MG tablet Take 1 tablet (4 mg total) by mouth every 6 (six) hours. 08/27/20   Gwyneth Sprout, MD    Allergies    Red dye and Other  Review of Systems   Review of Systems  Constitutional: Negative for chills and fever.  Cardiovascular: Positive for chest pain. Negative for palpitations and leg swelling.  Skin: Negative for color change and rash.  Neurological: Negative for headaches.  Psychiatric/Behavioral: Positive for dysphoric mood, hallucinations, sleep disturbance and suicidal ideas. The patient is nervous/anxious.   All other systems reviewed and are negative.   Physical Exam Updated Vital Signs BP (!) 147/96 (BP Location: Left Arm)   Pulse 70   Temp 98.6 F (37 C) (Oral)   Resp 18   Ht 6\' 1"  (1.854 m)   Wt (!) 157.5 kg   SpO2 99%   BMI 45.82 kg/m   Physical Exam Vitals and nursing note reviewed.  Constitutional:      General: He is not in acute distress.    Appearance: He is not diaphoretic.  HENT:     Head: Normocephalic and  atraumatic.  Eyes:     General: No scleral icterus.       Right eye: No discharge.        Left eye: No discharge.     Conjunctiva/sclera: Conjunctivae normal.  Cardiovascular:     Rate and Rhythm: Normal rate and regular rhythm.  Pulmonary:     Effort: Pulmonary effort is normal. No respiratory distress.     Breath sounds: No stridor.  Abdominal:     General: There is no distension.  Musculoskeletal:        General: No deformity.     Cervical back: Normal range of motion.  Skin:    General: Skin is warm and dry.  Neurological:     Mental Status: He is alert.     Motor: No abnormal muscle tone.  Psychiatric:        Mood and Affect: Affect is blunt and flat.        Behavior: Behavior is cooperative.        Thought Content: Thought content includes suicidal ideation. Thought content does not include homicidal ideation. Thought content includes suicidal plan. Thought content does not include homicidal plan.        Judgment: Judgment is impulsive.     Comments: Withdrawn, does not appear to be responding to internal stimuli.      ED Results / Procedures / Treatments   Labs (all labs ordered are listed, but only abnormal results are displayed) Labs Reviewed  COMPREHENSIVE METABOLIC PANEL - Abnormal; Notable for the following components:      Result Value   Sodium 134 (*)    Potassium 3.2 (*)    CO2 20 (*)    Glucose, Bld 110 (*)    Total Protein 8.3 (*)    ALT 66 (*)    All other components within normal limits  SALICYLATE LEVEL - Abnormal; Notable for the following components:   Salicylate Lvl <7.0 (*)    All other components within normal limits  CBC - Abnormal; Notable for the following components:   WBC 11.5 (*)    RBC 6.00 (*)    All other components within normal limits  RAPID URINE DRUG SCREEN, HOSP PERFORMED - Abnormal; Notable for the following components:   Tetrahydrocannabinol POSITIVE (*)    All other components within normal limits  RESP PANEL BY RT-PCR  (FLU A&B, COVID) ARPGX2  ETHANOL  ACETAMINOPHEN LEVEL  ACETAMINOPHEN LEVEL  TROPONIN I (HIGH SENSITIVITY)    EKG None  Radiology No results found.  Procedures Procedures   Medications Ordered in ED Medications - No data to display  ED Course  I have reviewed the triage vital signs and the nursing notes.  Pertinent labs & imaging results that were  available during my care of the patient were reviewed by me and considered in my medical decision making (see chart for details).  Clinical Course as of 09/23/20 2340  Fri Sep 23, 2020  1933 Waiting for patient to be re-wanded by security  [EH]  2336 Acetaminophen (Tylenol), S: 14 [EH]    Clinical Course User Index [EH] Norman Clay   MDM Rules/Calculators/A&P                         Patient is a 25 year old man who presents today for evaluation of suicidal ideation and chest pain. Chart review and review of outside records show that he has been evaluated multiple times recently, and generally with reassuring work-ups for atypical chest pain. He tells me that he got a bottle of Tylenol today and was thinking of overdosing on it in an attempt to kill himself.  He states that he did not and only took 2.  His initial acetaminophen level is undetected, however out of abundance of caution we will recheck a 4-hour.  He also states that he had today called the police near where his out-of-state sister lives and asked them to go talk with his sister, whom he does not speak with, to encourage her to talk with their mom after patient dies.  Troponin and EKG without abnormality, given multiple work-ups for chest pain which have been negative I doubt acute cardiopulmonary cause of his symptoms. Labs are generally reassuring, at this point pending 4-hour Tylenol level given that he states he had the bottle of Tylenol and only took 2 pills from it.  TTS is consulted.  If patient doesn't have elevated 4 hour acetaminophen level  anticipate medical clearance.  Dr. Blinda Leatherwood to follow-up on 4-hour acetaminophen level.  Note: Portions of this report may have been transcribed using voice recognition software. Every effort was made to ensure accuracy; however, inadvertent computerized transcription errors may be present.   Final Clinical Impression(s) / ED Diagnoses Final diagnoses:  Suicidal ideation  Atypical chest pain    Rx / DC Orders ED Discharge Orders    None       Cristina Gong, PA-C 09/23/20 2340    Melene Plan, DO 09/25/20 [phone]

## 2020-09-24 ENCOUNTER — Encounter (HOSPITAL_COMMUNITY): Payer: Self-pay | Admitting: Urology

## 2020-09-24 ENCOUNTER — Inpatient Hospital Stay (HOSPITAL_COMMUNITY)
Admission: AD | Admit: 2020-09-24 | Discharge: 2020-09-30 | DRG: 885 | Disposition: A | Discharge status: Home / Self Care | Payer: Federal, State, Local not specified - Other | Source: Intra-hospital | Attending: Psychiatry | Admitting: Psychiatry

## 2020-09-24 DIAGNOSIS — G4700 Insomnia, unspecified: Secondary | ICD-10-CM | POA: Diagnosis present

## 2020-09-24 DIAGNOSIS — F332 Major depressive disorder, recurrent severe without psychotic features: Secondary | ICD-10-CM | POA: Diagnosis not present

## 2020-09-24 DIAGNOSIS — F411 Generalized anxiety disorder: Secondary | ICD-10-CM | POA: Diagnosis present

## 2020-09-24 DIAGNOSIS — Z818 Family history of other mental and behavioral disorders: Secondary | ICD-10-CM | POA: Diagnosis not present

## 2020-09-24 DIAGNOSIS — Z9049 Acquired absence of other specified parts of digestive tract: Secondary | ICD-10-CM | POA: Diagnosis not present

## 2020-09-24 DIAGNOSIS — F32A Depression, unspecified: Secondary | ICD-10-CM | POA: Diagnosis present

## 2020-09-24 DIAGNOSIS — Z9102 Food additives allergy status: Secondary | ICD-10-CM | POA: Diagnosis not present

## 2020-09-24 DIAGNOSIS — Z888 Allergy status to other drugs, medicaments and biological substances status: Secondary | ICD-10-CM | POA: Diagnosis not present

## 2020-09-24 DIAGNOSIS — K219 Gastro-esophageal reflux disease without esophagitis: Secondary | ICD-10-CM | POA: Diagnosis present

## 2020-09-24 DIAGNOSIS — Z20822 Contact with and (suspected) exposure to covid-19: Secondary | ICD-10-CM | POA: Diagnosis present

## 2020-09-24 DIAGNOSIS — M549 Dorsalgia, unspecified: Secondary | ICD-10-CM | POA: Diagnosis present

## 2020-09-24 DIAGNOSIS — F419 Anxiety disorder, unspecified: Secondary | ICD-10-CM | POA: Diagnosis present

## 2020-09-24 DIAGNOSIS — Z560 Unemployment, unspecified: Secondary | ICD-10-CM | POA: Diagnosis not present

## 2020-09-24 DIAGNOSIS — R45851 Suicidal ideations: Secondary | ICD-10-CM | POA: Diagnosis present

## 2020-09-24 DIAGNOSIS — Z6841 Body Mass Index (BMI) 40.0 and over, adult: Secondary | ICD-10-CM | POA: Diagnosis not present

## 2020-09-24 HISTORY — DX: Other specified health status: Z78.9

## 2020-09-24 LAB — CBC WITH DIFFERENTIAL/PLATELET
Abs Immature Granulocytes: 0.04 10*3/uL (ref 0.00–0.07)
Basophils Absolute: 0 10*3/uL (ref 0.0–0.1)
Basophils Relative: 0 %
Eosinophils Absolute: 0.2 10*3/uL (ref 0.0–0.5)
Eosinophils Relative: 1 %
HCT: 49.6 % (ref 39.0–52.0)
Hemoglobin: 16.9 g/dL (ref 13.0–17.0)
Immature Granulocytes: 0 %
Lymphocytes Relative: 31 %
Lymphs Abs: 3.9 10*3/uL (ref 0.7–4.0)
MCH: 28.5 pg (ref 26.0–34.0)
MCHC: 34.1 g/dL (ref 30.0–36.0)
MCV: 83.8 fL (ref 80.0–100.0)
Monocytes Absolute: 0.6 10*3/uL (ref 0.1–1.0)
Monocytes Relative: 5 %
Neutro Abs: 7.7 10*3/uL (ref 1.7–7.7)
Neutrophils Relative %: 63 %
Platelets: 324 10*3/uL (ref 150–400)
RBC: 5.92 MIL/uL — ABNORMAL HIGH (ref 4.22–5.81)
RDW: 12.9 % (ref 11.5–15.5)
WBC: 12.5 10*3/uL — ABNORMAL HIGH (ref 4.0–10.5)
nRBC: 0 % (ref 0.0–0.2)

## 2020-09-24 LAB — COMPREHENSIVE METABOLIC PANEL
ALT: 63 U/L — ABNORMAL HIGH (ref 0–44)
AST: 25 U/L (ref 15–41)
Albumin: 4.8 g/dL (ref 3.5–5.0)
Alkaline Phosphatase: 80 U/L (ref 38–126)
Anion gap: 12 (ref 5–15)
BUN: 12 mg/dL (ref 6–20)
CO2: 20 mmol/L — ABNORMAL LOW (ref 22–32)
Calcium: 9.3 mg/dL (ref 8.9–10.3)
Chloride: 107 mmol/L (ref 98–111)
Creatinine, Ser: 0.92 mg/dL (ref 0.61–1.24)
GFR, Estimated: >60 mL/min (ref >60)
Glucose, Bld: 131 mg/dL — ABNORMAL HIGH (ref 70–99)
Potassium: 3.6 mmol/L (ref 3.5–5.1)
Sodium: 139 mmol/L (ref 135–145)
Total Bilirubin: 0.7 mg/dL (ref 0.3–1.2)
Total Protein: 8.3 g/dL — ABNORMAL HIGH (ref 6.5–8.1)

## 2020-09-24 LAB — LIPID PANEL
Cholesterol: 158 mg/dL (ref 0–200)
HDL: 27 mg/dL — ABNORMAL LOW (ref >40)
LDL Cholesterol: 60 mg/dL (ref 0–99)
Total CHOL/HDL Ratio: 5.9 RATIO
Triglycerides: 353 mg/dL — ABNORMAL HIGH (ref <150)
VLDL: 71 mg/dL — ABNORMAL HIGH (ref 0–40)

## 2020-09-24 LAB — ACETAMINOPHEN LEVEL: Acetaminophen (Tylenol), Serum: <10 ug/mL — ABNORMAL LOW (ref 10–30)

## 2020-09-24 LAB — TSH: TSH: 1.39 u[IU]/mL (ref 0.350–4.500)

## 2020-09-24 MED ORDER — QUETIAPINE FUMARATE 25 MG PO TABS
25.0000 mg | ORAL_TABLET | Freq: Every day | ORAL | Status: DC
Start: 2020-09-24 — End: 2020-09-26
  Administered 2020-09-24 – 2020-09-25 (×2): 25 mg via ORAL
  Filled 2020-09-24 (×5): qty 1

## 2020-09-24 MED ORDER — ALUM & MAG HYDROXIDE-SIMETH 200-200-20 MG/5ML PO SUSP
30.0000 mL | ORAL | Status: DC | PRN
  Administered 2020-09-24: 30 mL via ORAL
  Filled 2020-09-24: qty 30

## 2020-09-24 MED ORDER — ACETAMINOPHEN 325 MG PO TABS
650.0000 mg | ORAL_TABLET | Freq: Four times a day (QID) | ORAL | Status: DC | PRN
  Administered 2020-09-24 – 2020-09-30 (×4): 650 mg via ORAL
  Filled 2020-09-24 (×4): qty 2

## 2020-09-24 MED ORDER — SERTRALINE HCL 25 MG PO TABS
25.0000 mg | ORAL_TABLET | Freq: Every day | ORAL | Status: DC
  Administered 2020-09-24 – 2020-09-25 (×2): 25 mg via ORAL
  Filled 2020-09-24 (×5): qty 1

## 2020-09-24 MED ORDER — HYDROXYZINE HCL 25 MG PO TABS
25.0000 mg | ORAL_TABLET | Freq: Three times a day (TID) | ORAL | Status: DC | PRN
  Administered 2020-09-24 – 2020-09-27 (×8): 25 mg via ORAL
  Filled 2020-09-24 (×9): qty 1

## 2020-09-24 MED ORDER — TRAZODONE HCL 50 MG PO TABS
50.0000 mg | ORAL_TABLET | Freq: Every evening | ORAL | Status: DC | PRN
  Administered 2020-09-24 – 2020-09-26 (×4): 50 mg via ORAL
  Filled 2020-09-24 (×4): qty 1

## 2020-09-24 MED ORDER — POTASSIUM CHLORIDE CRYS ER 20 MEQ PO TBCR
40.0000 meq | EXTENDED_RELEASE_TABLET | Freq: Once | ORAL | Status: AC
  Administered 2020-09-24: 40 meq via ORAL
  Filled 2020-09-24: qty 2

## 2020-09-24 MED ORDER — MAGNESIUM HYDROXIDE 400 MG/5ML PO SUSP: 30.0000 mL | Freq: Every day | ORAL | Status: DC | PRN

## 2020-09-24 NOTE — [facility] Suicide Risk Assessment (Signed)
[facility] Admission Suicide Risk Assessment   Nursing information obtained from:  Patient Demographic factors:  Male, young adult, single, unemployed Current Mental Status:  Suicidal ideation indicated by patient,Self-harm thoughts,Suicide plan Loss Factors:  Health issues Historical Factors: previous suicide plan stopped by family Risk Reduction Factors:  Positive social support,Sense of responsibility to family,Living with another person, especially a relative  Total Time Spent in Direct Patient Care:  I personally spent 45 minutes on the unit in direct patient care. The direct patient care time included face-to-face time with the patient, reviewing the patient's chart, communicating with other professionals, and coordinating care. Greater than 50% of this time was spent in counseling or coordinating care with the patient regarding goals of hospitalization, psycho-education, and discharge planning needs.  Principal Problem: Depression Diagnosis:  Principal Problem:   Depression Active Problems:   Anxiety disorder, unspecified  Subjective Data: Patient is a 25y/o male admitted voluntarily for management of worsening depression, SI, and anxiety. According to TTS assessment, he presented unaccompanied to [facility] for chest discomfort, anxiety, depression and SI with thoughts of overdosing on Lexapro.  He states that he has suffered for the last 1 1/2 years with chronic depressed mood. He states that he has chronic passive SI, and 1 year ago had written letters to family with plans to "go to a right to die" state to "commit legal suicide" when he was stopped by his cousin. He states he never sought help for his depression and never has been in a psychiatric hospital. He reports that recently he has been having palpitations, chest tightness, "stinging in my heart" and heart racing and has undergone an extensive cardiac w/u. He was told by medical providers that his symptoms were c/w anxiety and are not  cardiac in nature. His PCP has reportedly referred him to cardiology for an assessment. He reportedly was started on Inderal and Vistaril by the ED 2 weeks ago for anxiety management, and his PCP started him on Lexapro last week. He states that after starting Lexapro he feels he had increased suicidal ideation and onset of intrusive, unwanted thoughts about death. He states the word death or dead would pop in his mind and he had to find distractions like cleaning, watching positive videos, or talking to his mother to get the thoughts to go away. He states that at one point he saw VH of his deceased aunt and had an AH of another deceased aunt telling him "death is soon." He admits, however, that he has been smoking 1 - 11/2 grams of THC daily and has been using Delta 8 and Delta 10 products daily to reduce his anxiety and stimulate his appetite. He denies alcohol or other drug use. He states he has always had intermittent issues with need for symmetry need to recheck car locks but is vague as to how time consuming or life impairing these issues are. He feels that everything he has heard on TV recently has referred to heart conditions or cardiac issues and this has made him more anxious about his own health. He states he occasionally has paranoid thoughts that he recognizes are irrational. He denies ideas of reference or first rank symptoms. He admits he may have 2 days of decreased need for sleep and a day of being more talkative but denies other true signs of mania/hypomania in the past. He endorses recent issues with poor sleep, low energy, poor focus, guilt, and anhedonia. See HP for additional details.   CLINICAL FACTORS:   Depression:  Anhedonia Hopelessness Insomnia Alcohol/Substance Abuse/Dependencies  Musculoskeletal: Strength & Muscle Tone: within normal limits Gait & Station: normal, steady Patient leans: N/A  Psychiatric Specialty Exam: Physical Exam Vitals reviewed.  HENT:     Head:  Normocephalic.  Pulmonary:     Comments: Takes occasional deep breaths during exam Neurological:     General: No focal deficit present.     Mental Status: He is alert.     Review of Systems - see H&P  Blood pressure (!) 149/97, pulse 99, temperature 98.4 F (36.9 C), temperature source Oral, resp. rate 16, height 6\' 1"  (1.854 m), weight (!) 156.5 kg, SpO2 98 %.Body mass index is 45.52 kg/m.  General Appearance: obese, adequate hygiene, appears stated age  Eye Contact:  Good  Speech:  Clear and Coherent and Normal Rate  Volume:  Normal  Mood:  Anxious  Affect:  Congruent  Thought Process:  Goal Directed  Orientation:  Full (Time, Place, and Person)  Thought Content:  Endorses recent intrusive thoughts about death and reports one episode of AH and one episode of VH; does not appear to be grossly responding to internal/external stimuli on exam  Suicidal Thoughts:  Denies current SI but had SI with plan prior to admission  Homicidal Thoughts:  No  Memory:  Recent;   Good  Judgement:  Fair  Insight:  Fair  Psychomotor Activity:  Normal  Concentration:  Concentration: Good  Recall:  Good  Fund of Knowledge:  Good  Language:  Good  Akathisia:  Negative  Assets:  Communication Skills Desire for Improvement Housing Resilience Social Support  ADL's:  Intact  Cognition:  WNL  Sleep:  Number of Hours: 2   COGNITIVE FEATURES THAT CONTRIBUTE TO RISK:  None    SUICIDE RISK:   Moderate:  Frequent suicidal ideation with limited intensity, and duration, some specificity in terms of plans, no associated intent, good self-control, limited dysphoria/symptomatology, some risk factors present, and identifiable protective factors, including available and accessible social support.  PLAN OF CARE: Patient admitted voluntary to the inpatient unit. Admission labs: CMP shows Na+ 134, K+ 3.2, CO2 20, glucose 110, total protein 8.3, AST 26 and ALT 66. (Appears ALT is chronically elevated up to 101 8  months ago) ETOH <10, Salicylate <7, Tylenol 14 and repeat <10, WBC 11.5, H/H17/48.9, platelets 308; UDS positive for THC; Troponin 3; respiratory panel negative; Repeat CBC, HbgA1c, repeat CMP, TSH and lipid pending; EKG shows NSR 71bpm with QTc and possible age indeterminate inferior infarct - repeating EKG; Patient being started on Zoloft 25mg  po qhs for management of depression, anxiety, and r/o OCD. The r/b/se/a to the medication including black box warning for use of antidepressants in his age range were discussed and he consents to med trial. He will be started on Seroquel 25mg  qhs to help with anxiety, to augment for depression, for sleep, and for vague report of recent AVH in the context of THC and Delta 8/10 use. The r/b/se/a to atypical antipsychotic use including risk of weight gain, TD/EPS, DM, and high cholesterol were discussed and he consents to med trial. He would benefit from CBT after discharge. He was counseled on the need to abstain from synthetic THC and THC products.  I certify that inpatient services furnished can reasonably be expected to improve the patient's condition.   , MD, FAPA 09/24/2020, 4:03 PM

## 2020-09-24 NOTE — [facility] Counselor (Signed)
Clinical Social Work Note  Patient has previously taken Lexapro (Escitalopram) and had INCREASED intrusive thoughts, visual hallucination of his deceased aunt's face, "zoning out," seeing shadows passing.  It also made him very fatigued and yet unable to sleep.  He confirmed with mother he was on 10mg .  , LCSW 09/24/2020, 2:40 PM

## 2020-09-24 NOTE — Progress Notes (Signed)
[place] NOVEL CORONAVIRUS (COVID-19) DAILY CHECK-OFF SYMPTOMS - answer yes or no to each - every day NO YES  Have you had a fever in the past 24 hours?  . Fever (Temp > 37.80C / 100F) X   Have you had any of these symptoms in the past 24 hours? . New Cough .  Sore Throat  .  Shortness of Breath .  Difficulty Breathing .  Unexplained Body Aches   X   Have you had any one of these symptoms in the past 24 hours not related to allergies?   . Runny Nose .  Nasal Congestion .  Sneezing   X   If you have had runny nose, nasal congestion, sneezing in the past 24 hours, has it worsened?  X   EXPOSURES - check yes or no X   Have you traveled outside the state in the past 14 days?  X   Have you been in contact with someone with a confirmed diagnosis of COVID-19 or PUI in the past 14 days without wearing appropriate PPE?  X   Have you been living in the same home as a person with confirmed diagnosis of COVID-19 or a PUI (household contact)?    X   Have you been diagnosed with COVID-19?    X              What to do next: Answered NO to all: Answered YES to anything:   Proceed with unit schedule Follow the [facility] Inpatient Flowsheet.   

## 2020-09-24 NOTE — Progress Notes (Signed)
Patient Leonard Clark 25 yr old was admitted voluntarily for SI .He reports increased anxiety and depression, He reports that he had a medication change 2 days ago for lexapro and started to feel very tired and drained. He reports constant thougthts of death seeing his aunt come to him in a dream telling him it will be over soon. He went to the ED c/o chest pain that was causing him increased anxiety. Cardiac work up done with results negative at ED. He reports THC use daily 1 1/2 grams daily. Patient called police near [place] to have them notify his sister about his SI because he did not want his mother to be alone. No medical history. Patient was calm and cooperative during admission, skin assessment completed.

## 2020-09-24 NOTE — ED Provider Notes (Signed)
4-hour Tylenol level is undetectable.  No further work-up necessary.  Patient is medically clear for psychiatric evaluation and treatment.   Gilda Crease, MD 09/24/20 [phone]

## 2020-09-24 NOTE — Progress Notes (Signed)
   09/24/20 2341  COVID-19 Daily Checkoff  Have you had a fever (temp > 37.80C/100F)  in the past 24 hours?  No  If you have had runny nose, nasal congestion, sneezing in the past 24 hours, has it worsened? No  COVID-19 EXPOSURE  Have you traveled outside the state in the past 14 days? No  Have you been in contact with someone with a confirmed diagnosis of COVID-19 or PUI in the past 14 days without wearing appropriate PPE? No  Have you been living in the same home as a person with confirmed diagnosis of COVID-19 or a PUI (household contact)? No  Have you been diagnosed with COVID-19? No

## 2020-09-24 NOTE — Tx Team (Signed)
Initial Treatment Plan 09/24/2020 4:08 AM [name] BJS:[id number]    PATIENT STRESSORS: Medication change or noncompliance Substance abuse   PATIENT STRENGTHS: Ability for insight Average or above average intelligence Supportive family/friends   PATIENT IDENTIFIED PROBLEMS: SI  Depression  THC use  Medication change recently        " No goal now"       DISCHARGE CRITERIA:  Improved stabilization in mood, thinking, and/or behavior  PRELIMINARY DISCHARGE PLAN: Return to previous living arrangement  PATIENT/FAMILY INVOLVEMENT: This treatment plan has been presented to and reviewed with the patient, Leonard Clark, and/or family member.  The patient and family have been given the opportunity to ask questions and make suggestions.  Floyce Stakes, RN 09/24/2020, 4:08 AM

## 2020-09-24 NOTE — Progress Notes (Signed)
   09/24/20 2349  Psych Admission Type (Psych Patients Only)  Admission Status Voluntary  Psychosocial Assessment  Patient Complaints Anxiety;Insomnia  Eye Contact Fair  Facial Expression Anxious  Affect Appropriate to circumstance  Speech Logical/coherent  Interaction Assertive  Motor Activity Other (Comment) (WDL)  Appearance/Hygiene Improved  Behavior Characteristics Appropriate to situation  Mood Anxious;Pleasant  Thought Process  Coherency WDL  Content WDL  Delusions None reported or observed  Perception WDL  Hallucination None reported or observed  Judgment Poor  Confusion None  Danger to Self  Current suicidal ideation? Denies  Self-Injurious Behavior No self-injurious ideation or behavior indicators observed or expressed   Agreement Not to Harm Self Yes  Description of Agreement verbal consent to notify staff if feeling suicidal  Danger to Others  Danger to Others None reported or observed
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
## 2020-09-24 NOTE — Progress Notes (Signed)
D. Pt presented with a depressed affect/ mood, observed sitting on the side of bed upon initial approach. Pt reported that he slept better last night, denied SI , but stated that he does have recurrent thoughts about death. Pt stated that he doesn't want to kill himself because he is all his mother has. Pt agreed to contact staff before acting on any harmful thoughts. Pt observed attending am group led by RN. A. Labs and vitals monitored. Pt supported emotionally and encouraged to express concerns and ask questions.   R. Pt remains safe with 15 minute checks. Will continue POC.

## 2020-09-25 DIAGNOSIS — F32A Depression, unspecified: Secondary | ICD-10-CM | POA: Diagnosis not present

## 2020-09-25 DIAGNOSIS — F411 Generalized anxiety disorder: Secondary | ICD-10-CM | POA: Diagnosis not present

## 2020-09-25 MED ORDER — SERTRALINE HCL 25 MG PO TABS
25.0000 mg | ORAL_TABLET | Freq: Every day | ORAL | Status: DC
  Filled 2020-09-25 (×2): qty 1

## 2020-09-25 MED ORDER — PANTOPRAZOLE SODIUM 40 MG PO TBEC
40.0000 mg | DELAYED_RELEASE_TABLET | Freq: Every day | ORAL | Status: DC
  Administered 2020-09-25 – 2020-09-30 (×6): 40 mg via ORAL
  Filled 2020-09-25 (×6): qty 1
  Filled 2020-09-25: qty 7
  Filled 2020-09-25 (×2): qty 1

## 2020-09-25 NOTE — Progress Notes (Signed)
   09/25/20 2357  Psych Admission Type (Psych Patients Only)  Admission Status Voluntary  Psychosocial Assessment  Patient Complaints Anxiety  Eye Contact Fair  Facial Expression Anxious  Affect Appropriate to circumstance  Speech Logical/coherent  Interaction Assertive  Motor Activity Other (Comment) (WDL)  Appearance/Hygiene Improved  Behavior Characteristics Appropriate to situation  Mood Anxious;Pleasant  Thought Process  Coherency WDL  Content WDL  Delusions None reported or observed  Perception WDL  Hallucination None reported or observed  Judgment Poor  Confusion None  Danger to Self  Current suicidal ideation? Denies  Self-Injurious Behavior No self-injurious ideation or behavior indicators observed or expressed   Agreement Not to Harm Self Yes  Description of Agreement verbal consent to notify staff if feeling suicidal  Danger to Others  Danger to Others None reported or observed

## 2020-09-25 NOTE — Progress Notes (Signed)
   09/25/20 2355  COVID-19 Daily Checkoff  Have you had a fever (temp > 37.80C/100F)  in the past 24 hours?  No  If you have had runny nose, nasal congestion, sneezing in the past 24 hours, has it worsened? No  COVID-19 EXPOSURE  Have you traveled outside the state in the past 14 days? No  Have you been in contact with someone with a confirmed diagnosis of COVID-19 or PUI in the past 14 days without wearing appropriate PPE? No  Have you been living in the same home as a person with confirmed diagnosis of COVID-19 or a PUI (household contact)? No  Have you been diagnosed with COVID-19? No

## 2020-09-25 NOTE — Progress Notes (Signed)
D. Pt presents with an improved mood today-per pt's self inventory, pt rated his depression, hopelessness and anxiety a 2/0/3, respectively. Pt reported that his goal to work on today is "to try and open up more by staying social".  Pt currently denies SI/HI and AVH A. Labs and vitals monitored. Pt given and educated on medications. Pt supported emotionally and encouraged to express concerns and ask questions.   R. Pt remains safe with 15 minute checks. Will continue POC.

## 2020-09-25 NOTE — Progress Notes (Signed)
[place] NOVEL CORONAVIRUS (COVID-19) DAILY CHECK-OFF SYMPTOMS - answer yes or no to each - every day NO YES  Have you had a fever in the past 24 hours?  . Fever (Temp > 37.80C / 100F) X   Have you had any of these symptoms in the past 24 hours? . New Cough .  Sore Throat  .  Shortness of Breath .  Difficulty Breathing .  Unexplained Body Aches   X   Have you had any one of these symptoms in the past 24 hours not related to allergies?   . Runny Nose .  Nasal Congestion .  Sneezing   X   If you have had runny nose, nasal congestion, sneezing in the past 24 hours, has it worsened?  X   EXPOSURES - check yes or no X   Have you traveled outside the state in the past 14 days?  X   Have you been in contact with someone with a confirmed diagnosis of COVID-19 or PUI in the past 14 days without wearing appropriate PPE?  X   Have you been living in the same home as a person with confirmed diagnosis of COVID-19 or a PUI (household contact)?    X   Have you been diagnosed with COVID-19?    X              What to do next: Answered NO to all: Answered YES to anything:   Proceed with unit schedule Follow the [facility] Inpatient Flowsheet.   

## 2020-09-25 NOTE — Progress Notes (Signed)
[facility] MD Progress Note  09/25/2020 9:44 AM Leonard Clark  MRN:  [id number] Subjective:  "I feel better today"   Objective: Patient was seen and evaluated. Patient stated he feels "better" today since he started on Zoloft and Seroquel. He stated he slept well last night and his appetite is improving. He stated he is not having the intrusive thoughts of death and he has not seen the word "death" pop up out of nowhere. He denies SI/HI/AVH, paranoia and delusions. Patient is attending group therapy and interacting appropriately with staff and peers. He stated he did feel tired this morning after taking his Zoloft and had to go lay down. Discussed taking Zoloft at bedtime, patient is agreeable with this plan. Patient complained of GERD symptoms, Protonix 40 mg daily ordered. Per below collateral from patient's mother by SW today, we will closely monitor patient's mood and safety if he does hear distressing news today about his sister. Ongoing support and encouragement provided.   Labs reviewed: A1c still pending, CMP with glucose 131, ALT 63, Total protein 8.3. CBC with diff with 12.5, RBC 5.92. EKG with NSR, QTc 438. Lipid panel with triglycerides 353, HDL 27, VLDL 71. Will repeat CBC with Diff and order fasting Lipid panel. Will refer patient to PCP for follow up to monitor lipids and liver function.   Collateral form SW note today: CSW spoke with mother Clemetine Marker [phone] to gather collateral information, answer questions, and provide Suicide Prevention Education.  She stated that she has upsetting news for the patient, and wanted to know how to go about delivering this news.  Before mother took patient to the hospital several days ago, he tried to contact his sister who lives in [place].  They have not spoken in a few years, as sister has cut off all contact with the family.  His attempt to contact her was through the police department in her city.  After patient was hospitalized, the police  department from sister's city were able to contact the sister and then called mother back.  She was informed that sister desires for them to NOT contact her by text or any other mean, and is in fact taking out a restraining order.  This is being encouraged by her boyfriend.  CSW informed mother that the best time for patient to receive that news is while he is inpatient, so she plans to tell him today.  She warned that he may make threatening statements toward sister's boyfriend.  Mother also shared that patient's maternal grandfather, maternal aunt, and sister have all attempted suicide.    The last person to prescribe medicine for patient was Dr. Graciela Husbands.  He has just recently been granted the [employer] for medical care.    Principal Problem: Depression Diagnosis: Principal Problem:   Depression Active Problems:   Anxiety disorder, unspecified  Total Time spent with patient: 25 minutes  Past Psychiatric History: See H&P  Past Medical History:  Past Medical History:  Diagnosis Date  . Medical history non-contributory     Past Surgical History:  Procedure Laterality Date  . ADENOIDECTOMY    . ANTERIOR CRUCIATE LIGAMENT REPAIR    . CHOLECYSTECTOMY    . TONSILLECTOMY    . TYMPANOSTOMY TUBE PLACEMENT     Family History: History reviewed. No pertinent family history. Family Psychiatric  History: See H&P Social History:  Social History   Substance and Sexual Activity  Alcohol Use Never     Social History   Substance  and Sexual Activity  Drug Use Yes  . Frequency: 1.5 times per week  . Types: Marijuana   Comment: 1.5 grams daily    Social History   Socioeconomic History  . Marital status: Single    Spouse name: Not on file  . Number of children: 0  . Years of education: Not on file  . Highest education level: GED or equivalent  Occupational History  . Not on file  Tobacco Use  . Smoking status: Former Games developer  . Smokeless tobacco: Never Used  Vaping Use   . Vaping Use: Former  Substance and Sexual Activity  . Alcohol use: Never  . Drug use: Yes    Frequency: 1.5 times per week    Types: Marijuana    Comment: 1.5 grams daily  . Sexual activity: Not Currently  Other Topics Concern  . Not on file  Social History Narrative  . Not on file   Social Determinants of Health   Financial Resource Strain: Not on file  Food Insecurity: Not on file  Transportation Needs: Not on file  Physical Activity: Not on file  Stress: Not on file  Social Connections: Not on file   Additional Social History:    Sleep: Good  Appetite:  Good  Current Medications: Current Facility-Administered Medications  Medication Dose Route Frequency Provider Last Rate Last Admin  . acetaminophen (TYLENOL) tablet 650 mg  650 mg Oral Q6H PRN Ajibola, Ene A, NP   650 mg at 09/24/20 1813  . alum & mag hydroxide-simeth (MAALOX/MYLANTA) 200-200-20 MG/5ML suspension 30 mL  30 mL Oral Q4H PRN Ajibola, Ene A, NP   30 mL at 09/24/20 1257  . hydrOXYzine (ATARAX/VISTARIL) tablet 25 mg  25 mg Oral TID PRN Ajibola, Ene A, NP   25 mg at 09/24/20 2100  . magnesium hydroxide (MILK OF MAGNESIA) suspension 30 mL  30 mL Oral Daily PRN Ajibola, Ene A, NP      . QUEtiapine (SEROQUEL) tablet 25 mg  25 mg Oral QHS Laveda Abbe, NP   25 mg at 09/24/20 2100  . sertraline (ZOLOFT) tablet 25 mg  25 mg Oral Daily Laveda Abbe, NP   25 mg at 09/25/20 3329  . traZODone (DESYREL) tablet 50 mg  50 mg Oral QHS PRN Ajibola, Ene A, NP   50 mg at 09/24/20 2100    Lab Results:  Results for orders placed or performed during the hospital encounter of 09/24/20 (from the past 48 hour(s))  Lipid panel     Status: Abnormal   Collection Time: 09/24/20  6:10 PM  Result Value Ref Range   Cholesterol 158 0 - 200 mg/dL   Triglycerides 518 (H) <150 mg/dL   HDL 27 (L) >84 mg/dL   Total CHOL/HDL Ratio 5.9 RATIO   VLDL 71 (H) 0 - 40 mg/dL   LDL Cholesterol 60 0 - 99 mg/dL    Comment:         Total Cholesterol/HDL:CHD Risk Coronary Heart Disease Risk Table                     Men   Women  1/2 Average Risk   3.4   3.3  Average Risk       5.0   4.4  2 X Average Risk   9.6   7.1  3 X Average Risk  23.4   11.0        Use the calculated Patient Ratio above and the CHD Risk  Table to determine the patient's CHD Risk.        ATP III CLASSIFICATION (LDL):  <100     mg/dL   Optimal  [id number]  mg/dL   Near or Above                    Optimal  130-159  mg/dL   Borderline  [id number]  mg/dL   High  >811>190     mg/dL   Very High Performed at [facility], [address] [place], [place] [address]   TSH     Status: None   Collection Time: 09/24/20  6:10 PM  Result Value Ref Range   TSH 1.390 0.350 - 4.500 uIU/mL    Comment: Performed by a 3rd Generation assay with a functional sensitivity of <=0.01 uIU/mL. Performed at [facility], [address] [place], [place] [address]   Comprehensive metabolic panel     Status: Abnormal   Collection Time: 09/24/20  6:10 PM  Result Value Ref Range   Sodium 139 135 - 145 mmol/L   Potassium 3.6 3.5 - 5.1 mmol/L   Chloride 107 98 - 111 mmol/L   CO2 20 (L) 22 - 32 mmol/L   Glucose, Bld 131 (H) 70 - 99 mg/dL    Comment: Glucose reference range applies only to samples taken after fasting for at least 8 hours.   BUN 12 6 - 20 mg/dL   Creatinine, Ser 1.300.92 0.61 - 1.24 mg/dL   Calcium 9.3 8.9 - 86.510.3 mg/dL   Total Protein 8.3 (H) 6.5 - 8.1 g/dL   Albumin 4.8 3.5 - 5.0 g/dL   AST 25 15 - 41 U/L   ALT 63 (H) 0 - 44 U/L   Alkaline Phosphatase 80 38 - 126 U/L   Total Bilirubin 0.7 0.3 - 1.2 mg/dL   GFR, Estimated >78>60 >46>60 mL/min    Comment: (NOTE) Calculated using the CKD-EPI Creatinine Equation (2021)    Anion gap 12 5 - 15    Comment: Performed at [facility], [address] [place], [place] [address]  CBC with Differential/Platelet     Status: Abnormal   Collection Time: 09/24/20  6:10  PM  Result Value Ref Range   WBC 12.5 (H) 4.0 - 10.5 K/uL   RBC 5.92 (H) 4.22 - 5.81 MIL/uL   Hemoglobin 16.9 13.0 - 17.0 g/dL   HCT 28.449.6 13.239.0 - 44.052.0 %   MCV 83.8 80.0 - 100.0 fL   MCH 28.5 26.0 - 34.0 pg   MCHC 34.1 30.0 - 36.0 g/dL   RDW 10.212.9 72.511.5 - 36.615.5 %   Platelets 324 150 - 400 K/uL   nRBC 0.0 0.0 - 0.2 %   Neutrophils Relative % 63 %   Neutro Abs 7.7 1.7 - 7.7 K/uL   Lymphocytes Relative 31 %   Lymphs Abs 3.9 0.7 - 4.0 K/uL   Monocytes Relative 5 %   Monocytes Absolute 0.6 0.1 - 1.0 K/uL   Eosinophils Relative 1 %   Eosinophils Absolute 0.2 0.0 - 0.5 K/uL   Basophils Relative 0 %   Basophils Absolute 0.0 0.0 - 0.1 K/uL   Immature Granulocytes 0 %   Abs Immature Granulocytes 0.04 0.00 - 0.07 K/uL    Comment: Performed at [facility], [address] [place], [place] [address]    Blood Alcohol level:  Lab Results  Component Value Date   ETH <10 09/23/2020  Metabolic Disorder Labs: No results found for: HGBA1C, MPG No results found for: PROLACTIN Lab Results  Component Value Date   CHOL 158 09/24/2020   TRIG 353 (H) 09/24/2020   HDL 27 (L) 09/24/2020   CHOLHDL 5.9 09/24/2020   VLDL 71 (H) 09/24/2020   LDLCALC 60 09/24/2020    Physical Findings: AIMS: Facial and Oral Movements Muscles of Facial Expression: None, normal Lips and Perioral Area: None, normal Jaw: None, normal Tongue: None, normal,Extremity Movements Upper (arms, wrists, hands, fingers): None, normal Lower (legs, knees, ankles, toes): None, normal, Trunk Movements Neck, shoulders, hips: None, normal, Overall Severity Severity of abnormal movements (highest score from questions above): None, normal Incapacitation due to abnormal movements: None, normal Patient's awareness of abnormal movements (rate only patient's report): No Awareness, Dental Status Current problems with teeth and/or dentures?: No Does patient usually wear dentures?: No  CIWA:    COWS:      Musculoskeletal: Strength & Muscle Tone: within normal limits Gait & Station: normal Patient leans: N/A  Psychiatric Specialty Exam:  Presentation  General Appearance: Appropriate for Environment; Casual  Eye Contact:Good  Speech:Clear and Coherent; Normal Rate  Speech Volume:Normal  Handedness:Right  Mood and Affect  Mood:Anxious  Affect:Appropriate; Congruent  Thought Process  Thought Processes:Coherent; Goal Directed; Linear  Descriptions of Associations:Intact  Orientation:Full (Time, Place and Person)  Thought Content:Logical  History of Schizophrenia/Schizoaffective disorder:No  Duration of Psychotic Symptoms:No data recorded Hallucinations:Hallucinations: None  Ideas of Reference:None  Suicidal Thoughts:Suicidal Thoughts: No  Homicidal Thoughts:Homicidal Thoughts: No  Sensorium  Memory:Immediate Good; Recent Good; Remote Fair  Judgment:Fair  Insight:Fair  Executive Functions  Concentration:Good  Attention Span:Good  Recall:Good  Fund of Knowledge:Good  Language:Good  Psychomotor Activity  Psychomotor Activity:Psychomotor Activity: Normal  Assets  Assets:Communication Skills; Desire for Improvement; Financial Resources/Insurance; Housing; Physical Health; Resilience; Social Support  Sleep  Sleep:Sleep: Fair  Physical Exam: Physical Exam Vitals and nursing note reviewed.  Constitutional:      Appearance: He is obese.  HENT:     Head: Normocephalic.  Musculoskeletal:        General: Normal range of motion.     Cervical back: Normal range of motion.  Neurological:     Mental Status: He is alert and oriented to person, place, and time.  Psychiatric:        Attention and Perception: He perceives auditory and visual hallucinations.        Mood and Affect: Mood is anxious and depressed.        Speech: Speech normal.        Behavior: Behavior normal. Behavior is cooperative.        Thought Content: Thought content is not  paranoid or delusional. Thought content does not include homicidal ideation. Thought content does not include homicidal plan.        Cognition and Memory: Cognition normal.    Review of Systems  Constitutional: Negative.  Negative for fever.  HENT: Negative.  Negative for congestion and sore throat.   Respiratory: Negative.  Negative for cough and shortness of breath.   Cardiovascular: Negative.  Negative for chest pain.  Gastrointestinal: Negative.   Genitourinary: Negative.   Musculoskeletal: Negative.   Neurological: Negative.    Blood pressure (!) 150/97, pulse 73, temperature 98 F (36.7 C), temperature source Oral, resp. rate 16, height 6\' 1"  (1.854 m), weight (!) 156.5 kg, SpO2 99 %. Body mass index is 45.52 kg/m.   Treatment Plan Summary: Daily contact with patient to assess and evaluate symptoms  and progress in treatment and Medication management   Depression:  -Continue Zoloft 25 mg PO qhs. Changed from morning to bedtime per patient request and complaint of feeling too sedated this morning.  -Continue Seroquel 25 mg PO qhs for auditory hallucinations and depression.   Insomnia:  -Continue Trazodone 50 mg PO qhs PRN   Anxiety: -Continue Vistaril 25 mg PO TID PRN  GERD:  -Continue Protonix 40 mg PO daily   Laveda Abbe, NP 09/25/2020, 9:44 AM

## 2020-09-25 NOTE — [facility] Suicide Risk Assessment (Signed)
[facility] INPATIENT:  Family/Significant Other Suicide Prevention Education    CSW spoke with mother Leonard Clark [phone] to gather collateral information, answer questions, and provide Suicide Prevention Education.  She stated that she has upsetting news for the patient, and wanted to know how to go about delivering this news.  Before mother took patient to the hospital several days ago, he tried to contact his sister who lives in [place].  They have not spoken in a few years, as sister has cut off all contact with the family.  His attempt to contact her was through the police department in her city.  After patient was hospitalized, the police department from sister's city were able to contact the sister and then called mother back.  She was informed that sister desires for them to NOT contact her by text or any other mean, and is in fact taking out a restraining order.  This is being encouraged by her boyfriend.  CSW informed mother that the best time for patient to receive that news is while he is inpatient, so she plans to tell him today.  She warned that he may make threatening statements toward sister's boyfriend.  Mother also shared that patient's maternal grandfather, maternal aunt, and sister have all attempted suicide.    The last person to prescribe medicine for patient was Dr. Graciela Husbands.  He has just recently been granted the [employer] for medical care.    Suicide Prevention Education:  Education Completed; mother Leonard Clark [phone],  (name of family member/significant other) has been identified by the patient as the family member/significant other with whom the patient will be residing, and identified as the person(s) who will aid the patient in the event of a mental health crisis (suicidal ideations/suicide attempt).  With written consent from the patient, the family member/significant other has been provided the following suicide prevention education, prior to the and/or  following the discharge of the patient.  The suicide prevention education provided includes the following:  Suicide risk factors  Suicide prevention and interventions  National Suicide Hotline telephone number  [facility] assessment telephone number  [facility] Emergency Assistance 911  [facility] and/or Residential Mobile Crisis Unit telephone number  Request made of family/significant other to:  Remove weapons (e.g., guns, rifles, knives), all items previously/currently identified as safety concern.    Remove drugs/medications (over-the-counter, prescriptions, illicit drugs), all items previously/currently identified as a safety concern.  The family member/significant other verbalizes understanding of the suicide prevention education information provided.  The family member/significant other agrees to remove the items of safety concern listed above.  Leonard Clark 09/25/2020, 1:09 PM

## 2020-09-26 DIAGNOSIS — F32A Depression, unspecified: Secondary | ICD-10-CM | POA: Diagnosis not present

## 2020-09-26 DIAGNOSIS — F411 Generalized anxiety disorder: Secondary | ICD-10-CM | POA: Diagnosis not present

## 2020-09-26 LAB — CBC WITH DIFFERENTIAL/PLATELET
Abs Immature Granulocytes: 0.04 10*3/uL (ref 0.00–0.07)
Basophils Absolute: 0 10*3/uL (ref 0.0–0.1)
Basophils Relative: 0 %
Eosinophils Absolute: 0.3 10*3/uL (ref 0.0–0.5)
Eosinophils Relative: 2 %
HCT: 46.5 % (ref 39.0–52.0)
Hemoglobin: 15.7 g/dL (ref 13.0–17.0)
Immature Granulocytes: 0 %
Lymphocytes Relative: 39 %
Lymphs Abs: 4.1 10*3/uL — ABNORMAL HIGH (ref 0.7–4.0)
MCH: 28.6 pg (ref 26.0–34.0)
MCHC: 33.8 g/dL (ref 30.0–36.0)
MCV: 84.7 fL (ref 80.0–100.0)
Monocytes Absolute: 0.6 10*3/uL (ref 0.1–1.0)
Monocytes Relative: 6 %
Neutro Abs: 5.6 10*3/uL (ref 1.7–7.7)
Neutrophils Relative %: 53 %
Platelets: 239 10*3/uL (ref 150–400)
RBC: 5.49 MIL/uL (ref 4.22–5.81)
RDW: 13.1 % (ref 11.5–15.5)
WBC: 10.6 10*3/uL — ABNORMAL HIGH (ref 4.0–10.5)
nRBC: 0 % (ref 0.0–0.2)

## 2020-09-26 LAB — LIPID PANEL
Cholesterol: 135 mg/dL (ref 0–200)
HDL: 26 mg/dL — ABNORMAL LOW (ref >40)
LDL Cholesterol: 71 mg/dL (ref 0–99)
Total CHOL/HDL Ratio: 5.2 RATIO
Triglycerides: 191 mg/dL — ABNORMAL HIGH (ref <150)
VLDL: 38 mg/dL (ref 0–40)

## 2020-09-26 LAB — HEMOGLOBIN A1C
Hgb A1c MFr Bld: 5.6 % (ref 4.8–5.6)
Mean Plasma Glucose: 114 mg/dL

## 2020-09-26 MED ORDER — SERTRALINE HCL 50 MG PO TABS
50.0000 mg | ORAL_TABLET | Freq: Every day | ORAL | Status: DC
Start: 2020-09-26 — End: 2020-09-30
  Administered 2020-09-26 – 2020-09-29 (×4): 50 mg via ORAL
  Filled 2020-09-26: qty 7
  Filled 2020-09-26 (×3): qty 1
  Filled 2020-09-26 (×2): qty 7
  Filled 2020-09-26 (×2): qty 1

## 2020-09-26 MED ORDER — QUETIAPINE FUMARATE 50 MG PO TABS
50.0000 mg | ORAL_TABLET | Freq: Every day | ORAL | Status: DC
  Administered 2020-09-26 – 2020-09-29 (×4): 50 mg via ORAL
  Filled 2020-09-26 (×3): qty 1
  Filled 2020-09-26: qty 7
  Filled 2020-09-26 (×2): qty 1
  Filled 2020-09-26 (×2): qty 7

## 2020-09-26 MED ORDER — ADULT MULTIVITAMIN W/MINERALS CH
1.0000 | ORAL_TABLET | Freq: Every day | ORAL | Status: DC
  Administered 2020-09-26 – 2020-09-30 (×5): 1 via ORAL
  Filled 2020-09-26 (×8): qty 1

## 2020-09-26 NOTE — Tx Team (Signed)
Interdisciplinary Treatment and Diagnostic Plan Update  09/26/2020 Time of Session: 9:10am Leonard Clark MRN: [id number]  Principal Diagnosis: Depression  Secondary Diagnoses: Principal Problem:   Depression Active Problems:   Anxiety disorder, unspecified   Current Medications:  Current Facility-Administered Medications  Medication Dose Route Frequency Provider Last Rate Last Admin  . acetaminophen (TYLENOL) tablet 650 mg  650 mg Oral Q6H PRN Ajibola, Ene A, NP   650 mg at 09/24/20 1813  . alum & mag hydroxide-simeth (MAALOX/MYLANTA) 200-200-20 MG/5ML suspension 30 mL  30 mL Oral Q4H PRN Ajibola, Ene A, NP   30 mL at 09/24/20 1257  . hydrOXYzine (ATARAX/VISTARIL) tablet 25 mg  25 mg Oral TID PRN Ajibola, Ene A, NP   25 mg at 09/25/20 2117  . magnesium hydroxide (MILK OF MAGNESIA) suspension 30 mL  30 mL Oral Daily PRN Ajibola, Ene A, NP      . multivitamin with minerals tablet 1 tablet  1 tablet Oral Daily Antonieta Pert, MD      . pantoprazole (PROTONIX) EC tablet 40 mg  40 mg Oral Daily Comer Locket, MD   40 mg at 09/26/20 0810  . QUEtiapine (SEROQUEL) tablet 25 mg  25 mg Oral QHS Laveda Abbe, NP   25 mg at 09/25/20 2117  . sertraline (ZOLOFT) tablet 25 mg  25 mg Oral QHS Laveda Abbe, NP      . traZODone (DESYREL) tablet 50 mg  50 mg Oral QHS PRN Ajibola, Ene A, NP   50 mg at 09/25/20 2117   PTA Medications: Medications Prior to Admission  Medication Sig Dispense Refill Last Dose  . acetaminophen (TYLENOL) 325 MG tablet Take 650 mg by mouth every 6 (six) hours as needed for mild pain or headache.     . hydrOXYzine (ATARAX/VISTARIL) 25 MG tablet Take 1 tablet (25 mg total) by mouth every 6 (six) hours. 12 tablet 0 09/24/2020 at Unknown time  . escitalopram (LEXAPRO) 10 MG tablet Take 10 mg by mouth daily.   Unknown at Unknown time  . hydrOXYzine (ATARAX/VISTARIL) 25 MG tablet Take by mouth.     . ondansetron (ZOFRAN ODT) 8 MG disintegrating tablet Take 1  tablet (8 mg total) by mouth every 8 (eight) hours as needed for nausea or vomiting. (Patient not taking: Reported on 09/24/2020) 10 tablet 1 Not Taking at Unknown time  . ondansetron (ZOFRAN) 4 MG tablet Take 1 tablet (4 mg total) by mouth every 6 (six) hours. (Patient not taking: Reported on 09/24/2020) 12 tablet 0 Not Taking at Unknown time  . propranolol (INDERAL) 10 MG tablet Take by mouth. (Patient not taking: Reported on 09/24/2020)   Not Taking at Unknown time    Patient Stressors: Medication change or noncompliance Substance abuse  Patient Strengths: Ability for insight Average or above average intelligence Supportive family/friends  Treatment Modalities: Medication Management, Group therapy, Case management,  1 to 1 session with clinician, Psychoeducation, Recreational therapy.   Physician Treatment Plan for Primary Diagnosis: Depression Long Term Goal(s): Improvement in symptoms so as ready for discharge Improvement in symptoms so as ready for discharge   Short Term Goals: Ability to identify changes in lifestyle to reduce recurrence of condition will improve Ability to verbalize feelings will improve Ability to disclose and discuss suicidal ideas Ability to identify and develop effective coping behaviors will improve Ability to identify triggers associated with substance abuse/mental health issues will improve Ability to identify changes in lifestyle to reduce recurrence of condition will improve  Ability to verbalize feelings will improve Ability to disclose and discuss suicidal ideas Ability to identify and develop effective coping behaviors will improve Compliance with prescribed medications will improve Ability to identify triggers associated with substance abuse/mental health issues will improve  Medication Management: Evaluate patient's response, side effects, and tolerance of medication regimen.  Therapeutic Interventions: 1 to 1 sessions, Unit Group sessions and  Medication administration.  Evaluation of Outcomes: Progressing  Physician Treatment Plan for Secondary Diagnosis: Principal Problem:   Depression Active Problems:   Anxiety disorder, unspecified  Long Term Goal(s): Improvement in symptoms so as ready for discharge Improvement in symptoms so as ready for discharge   Short Term Goals: Ability to identify changes in lifestyle to reduce recurrence of condition will improve Ability to verbalize feelings will improve Ability to disclose and discuss suicidal ideas Ability to identify and develop effective coping behaviors will improve Ability to identify triggers associated with substance abuse/mental health issues will improve Ability to identify changes in lifestyle to reduce recurrence of condition will improve Ability to verbalize feelings will improve Ability to disclose and discuss suicidal ideas Ability to identify and develop effective coping behaviors will improve Compliance with prescribed medications will improve Ability to identify triggers associated with substance abuse/mental health issues will improve     Medication Management: Evaluate patient's response, side effects, and tolerance of medication regimen.  Therapeutic Interventions: 1 to 1 sessions, Unit Group sessions and Medication administration.  Evaluation of Outcomes: Progressing   RN Treatment Plan for Primary Diagnosis: Depression Long Term Goal(s): Knowledge of disease and therapeutic regimen to maintain health will improve  Short Term Goals: Ability to verbalize frustration and anger appropriately will improve, Ability to verbalize feelings will improve, Ability to disclose and discuss suicidal ideas and Ability to identify and develop effective coping behaviors will improve  Medication Management: RN will administer medications as ordered by provider, will assess and evaluate patient's response and provide education to patient for prescribed medication. RN  will report any adverse and/or side effects to prescribing provider.  Therapeutic Interventions: 1 on 1 counseling sessions, Psychoeducation, Medication administration, Evaluate responses to treatment, Monitor vital signs and CBGs as ordered, Perform/monitor CIWA, COWS, AIMS and Fall Risk screenings as ordered, Perform wound care treatments as ordered.  Evaluation of Outcomes: Progressing   LCSW Treatment Plan for Primary Diagnosis: Depression Long Term Goal(s): Safe transition to appropriate next level of care at discharge, Engage patient in therapeutic group addressing interpersonal concerns.  Short Term Goals: Engage patient in aftercare planning with referrals and resources, Increase social support, Increase ability to appropriately verbalize feelings, Increase emotional regulation, Facilitate acceptance of mental health diagnosis and concerns, Identify triggers associated with mental health/substance abuse issues and Increase skills for wellness and recovery  Therapeutic Interventions: Assess for all discharge needs, 1 to 1 time with Social worker, Explore available resources and support systems, Assess for adequacy in community support network, Educate family and significant other(s) on suicide prevention, Complete Psychosocial Assessment, Interpersonal group therapy.  Evaluation of Outcomes: Progressing   Progress in Treatment: Attending groups: Yes. Participating in groups: Yes. Taking medication as prescribed: Yes. Toleration medication: Yes. Family/Significant other contact made: Yes, individual(s) contacted:  Mother, Clemetine Marker Patient understands diagnosis: Yes. Discussing patient identified problems/goals with staff: Yes. Medical problems stabilized or resolved: Yes. Denies suicidal/homicidal ideation: Yes. Issues/concerns per patient self-inventory: No.   New problem(s) identified: No, Describe:  none reported  New Short Term/Long Term Goal(s):   medication  stabilization, elimination of SI thoughts,  development of comprehensive mental wellness plan.    Patient Goals:  Patient state that he would like to increase social interaction with peers and be more open about his feelings.   Discharge Plan or Barriers: Patient recently admitted. CSW will continue to follow and assess for appropriate referrals and possible discharge planning.    Reason for Continuation of Hospitalization: Anxiety Depression Suicidal ideation  Estimated Length of Stay: 3 to 5 days Attendees: Patient: Leonard Clark 09/26/2020   Physician: Bartholomew Crews, MD 09/26/2020   Nursing:  09/26/2020   RN Care Manager: 09/26/2020   Social Worker: Anson [place], LCSW 09/26/2020   Recreational Therapist:  09/26/2020   Other:  09/26/2020   Other:  09/26/2020   Other: 09/26/2020     Scribe for Treatment Team: Beatris Si, LCSW 09/26/2020 11:05 AM

## 2020-09-26 NOTE — Progress Notes (Signed)
Recreation Therapy Notes  Date:  4.18.22 Time: 0930 Location: [address] Dayroom  Group Topic: Stress Management  Goal Area(s) Addresses:  Patient will identify positive stress management techniques. Patient will identify benefits of using stress management post d/c.  Intervention:  Stress Management  Activity: Meditation.  LRT played a meditation that was a body scan which encouraged patients to identify any sensations, tightness, hot or cool feelings they may have been feeling throughout their bodies.  Patients were to acknowledge what they were feeling and to relax those areas for complete relaxation.    Education:  Stress Management, Discharge Planning.   Education Outcome: Acknowledges Education  Clinical Observations/Feedback: Patient did not attend group session.    Minsa Weddington Linsay, LRT/CTRS         Caroll Rancher A 09/26/2020 10:57 AM

## 2020-09-26 NOTE — [facility] Group Notes (Signed)
Therapy Type: Group Therapy  Participation Level:  Active   Patients watched a Ted Talk titled Confessions of a Depressed Comic. Patients discussed to content of this video and discussed how it feels to deal with mental illness. Patients were given a worksheet with two stick figures, one titled "What I see" and "What others See". Patients were asked to write down what they see about themselves and what others think about them. Patients were then asked what were 5 things they wish people knew/understood about them. CSW initiated discussion based on patients worksheets. Patients discussed how others view them make them feel and what they wish was understood about them.  Patient Summary:  Pt came into group late and was zoned out throughout much of group. Pt later shared insight during discussion.     Fredirick Lathe, LCSWA Clinicial Social Worker [employer]

## 2020-09-26 NOTE — [facility] Group Notes (Signed)
Pt did not attend group.  ADULT GRIEF GROUP NOTE:   Spiritual care group on grief and loss facilitated by Kathleen Argue, [facility]   Group Goal:   Support / Education around grief and loss   Members engage in facilitated group support and psycho-social education.   Group Description:   Following introductions and group rules, group members engaged in facilitated group dialog and support around topic of loss, with particular support around experiences of loss in their lives. Group Identified types of loss (relationships / self / things) and identified patterns, circumstances, and changes that precipitate losses. Reflected on thoughts / feelings around loss, normalized grief responses, and recognized variety in grief experience. Group noted Worden's four tasks of grief in discussion.   Group drew on Adlerian / Rogerian, narrative, MI,

## 2020-09-26 NOTE — Progress Notes (Signed)
   09/26/20 0635  Vital Signs  Temp 97.6 F (36.4 C)  Temp Source Oral  Pulse Rate 83  Pulse Rate Source Monitor  BP 129/84  BP Location Left Arm  BP Method Automatic  Patient Position (if appropriate) Sitting  Oxygen Therapy  SpO2 97 %    D: Pt.denies SI/HI/AVH. Pt. Denies anxiety and depressionl A:  Patient took scheduled medicine.  Support and encouragement provided Routine safety checks conducted every 15 minutes. Patient  Informed to notify staff with any concerns.   R:  Safety maintained.

## 2020-09-26 NOTE — Progress Notes (Signed)
NUTRITION ASSESSMENT RD working remotely.  Pt identified as at risk on the Malnutrition Screen Tool  INTERVENTION: - will order 1 tablet multivitamin with minerals/day.  [facility] staff to continue to encourage PO intakes of meals and snacks.   NUTRITION DIAGNOSIS: Unintentional weight loss related to sub-optimal intake as evidenced by pt report.   Goal: Pt to meet >/= 90% of their estimated nutrition needs.  Monitor:  PO intake  Assessment:  Patient admitted for worsened depression, anxiety, and SI. He denied any suicide attempts in the past.   Patient reported decreased appetite, decreased concentration, increase fatigue, and only sleeping 1-3 hours/night.   Weight on 4/16 was 345 lb, which appears to be a stated weight. Weight on 4/15 was 346 lb, weight on 08/23/20 was 348 lb, and weight on 01/26/20 was 357 lb. This indicates 12 lb weight loss (4.7% body weight) in the past 8 months; not significant for time frame.    25 y.o. male  Height: Ht Readings from Last 1 Encounters:  09/24/20 6\' 1"  (1.854 m)    Weight: Wt Readings from Last 1 Encounters:  09/24/20 (!) 156.5 kg    Weight Hx: Wt Readings from Last 10 Encounters:  09/24/20 (!) 156.5 kg  09/23/20 (!) 157.5 kg  08/27/20 (!) 156.9 kg  08/23/20 (!) 158.3 kg  05/01/20 (!) 157.9 kg  01/26/20 (!) 162.4 kg  12/30/19 (!) 161.5 kg    BMI:  Body mass index is 45.52 kg/m. Pt meets criteria for morbid obesity based on current BMI.  Estimated Nutritional Needs: Kcal: 25-30 kcal/kg Protein: > 1 gram protein/kg Fluid: 1 ml/kcal  Diet Order:  Diet Order            Diet regular Room service appropriate? Yes; Fluid consistency: Thin  Diet effective now                Pt is also offered choice of unit snacks mid-morning and mid-afternoon.  Pt is eating as desired.   Lab results and medications reviewed.       01/01/20, MS, RD, LDN, CNSC Inpatient Clinical Dietitian RD pager # available in [facility]   After hours/weekend pager # available in [facility]

## 2020-09-26 NOTE — [facility] Group Notes (Signed)
Occupational Therapy Group Note Date: 09/26/2020 Group Topic/Focus: Feelings Management  Group Description: Group encouraged increased engagement and participation through discussion focused on Building Happiness. Patients were provided a handout and reviewed therapeutic strategies to build happiness including identifying gratitudes, random acts of kindness, exercise, meditation, positive journaling, and fostering relationships. Patients engaged in discussion and encouraged to reflect on each strategy and their experiences.  Therapeutic Goal(s): Identify strategies to build happiness. Identify and implement therapeutic strategies to improve overall mood. Practice and identify gratitudes, random acts of kindness, exercise, meditation, positive journaling, and fostering relationships Participation Level: Active   Participation Quality: Independent   Behavior: Calm, Cooperative and Interactive   Speech/Thought Process: Focused   Affect/Mood: Full range   Insight: Moderate   Judgement: Moderate   Individualization: Leonard Clark was active in their participation of group discussion/activity. Pt identified happiness to him is "helping other people" and shared an example in which he allowed a childhood friend to live with him for 3-4 years without asking for money or rent. He shared he likes to change others lives as it "takes the focus off of me."  Modes of Intervention: Activity, Discussion, Education and Support  Patient Response to Interventions:  Attentive, Engaged, Receptive and Interested   Plan: Continue to engage patient in OT groups 2 - 3x/week.  09/26/2020  Donne Hazel, MOT, OTR/L

## 2020-09-26 NOTE — BHH Counselor (Signed)
CSW was stopped by this patient in the hallway. Patient stated that he has had self realizations since being in the hospital and attending groups. Patient shared that he had lied to staff and to himself this morning stating he was tired, however stated in actuality he felt that sleep is the closest he can get to death at this time. Pt was tearful at this time. Pt set goals for himself to get out of bed in the morning in time to go to breakfast.     Ruthann Cancer MSW, LCSW Clincal Social Worker  [facility]

## 2020-09-26 NOTE — Progress Notes (Addendum)
[facility] MD Progress Note  09/26/2020 12:53 PM Leonard Clark  MRN:  [id number] Subjective:  "I did not sleep hardly at all last night"   Objective: Patient was seen and evaluated. He is in his room, lying down. He did not go to the cafeteria for lunch.  Patient stated he did not sleep well last night, tossed and turned. He agrees that the news his mother gave him about his sister wanting no contact with them likely is the source of his lack of sleep. His appetite is improving. He stated he is having random intrusive thoughts and once thought of tying towels together. He stated he would not act on the thought but it was very random. He also stated he sometimes feels as if he is going to attacked or hurt. This has been going on for less than 1 year and it is random and does not happen often. He is agreeable to having his Seroquel increased.  He denies SI/HI/AVH, paranoia and delusions. Patient did get up to attend afternoon group. He is interacting appropriately with staff and peers. Ongoing support and encouragement provided.   Labs reviewed: A1c 5.6, Fasting Lipid panel with triglycerides 191, HDL 26. CBC with diff with WBC 10.6 down from 12.5, lymphocytes 4.1. Will refer patient to PCP for follow up to monitor lipids and liver function. EKG 4/16 shows NSR 87bpm with QTc  Collateral form SW note today: CSW spoke with mother Clemetine Marker [phone] to gather collateral information, answer questions, and provide Suicide Prevention Education.  She stated that she has upsetting news for the patient, and wanted to know how to go about delivering this news.  Before mother took patient to the hospital several days ago, he tried to contact his sister who lives in [place] state.  They have not spoken in a few years, as sister has cut off all contact with the family.  His attempt to contact her was through the police department in her city.  After patient was hospitalized, the police department from sister's  city were able to contact the sister and then called mother back.  She was informed that sister desires for them to NOT contact her by text or any other mean, and is in fact taking out a restraining order.  This is being encouraged by her boyfriend.  CSW informed mother that the best time for patient to receive that news is while he is inpatient, so she plans to tell him today.  She warned that he may make threatening statements toward sister's boyfriend.  Mother also shared that patient's maternal grandfather, maternal aunt, and sister have all attempted suicide.    The last person to prescribe medicine for patient was Dr. Graciela Husbands.  He has just recently been granted the [employer] for medical care.    Principal Problem: Depression Diagnosis: Principal Problem:   Depression Active Problems:   Anxiety disorder, unspecified  Total Time spent with patient: 25 minutes  Past Psychiatric History: See H&P  Past Medical History:  Past Medical History:  Diagnosis Date  . Medical history non-contributory     Past Surgical History:  Procedure Laterality Date  . ADENOIDECTOMY    . ANTERIOR CRUCIATE LIGAMENT REPAIR    . CHOLECYSTECTOMY    . TONSILLECTOMY    . TYMPANOSTOMY TUBE PLACEMENT     Family History: History reviewed. No pertinent family history. Family Psychiatric  History: See H&P Social History:  Social History   Substance and Sexual Activity  Alcohol Use  Never     Social History   Substance and Sexual Activity  Drug Use Yes  . Frequency: 1.5 times per week  . Types: Marijuana   Comment: 1.5 grams daily    Social History   Socioeconomic History  . Marital status: Single    Spouse name: Not on file  . Number of children: 0  . Years of education: Not on file  . Highest education level: GED or equivalent  Occupational History  . Not on file  Tobacco Use  . Smoking status: Former Games developer  . Smokeless tobacco: Never Used  Vaping Use  . Vaping Use: Former   Substance and Sexual Activity  . Alcohol use: Never  . Drug use: Yes    Frequency: 1.5 times per week    Types: Marijuana    Comment: 1.5 grams daily  . Sexual activity: Not Currently  Other Topics Concern  . Not on file  Social History Narrative  . Not on file   Social Determinants of Health   Financial Resource Strain: Not on file  Food Insecurity: Not on file  Transportation Needs: Not on file  Physical Activity: Not on file  Stress: Not on file  Social Connections: Not on file   Additional Social History:    Sleep: Good  Appetite:  Good  Current Medications: Current Facility-Administered Medications  Medication Dose Route Frequency Provider Last Rate Last Admin  . acetaminophen (TYLENOL) tablet 650 mg  650 mg Oral Q6H PRN Ajibola, Ene A, NP   650 mg at 09/24/20 1813  . alum & mag hydroxide-simeth (MAALOX/MYLANTA) 200-200-20 MG/5ML suspension 30 mL  30 mL Oral Q4H PRN Ajibola, Ene A, NP   30 mL at 09/24/20 1257  . hydrOXYzine (ATARAX/VISTARIL) tablet 25 mg  25 mg Oral TID PRN Ajibola, Ene A, NP   25 mg at 09/25/20 2117  . magnesium hydroxide (MILK OF MAGNESIA) suspension 30 mL  30 mL Oral Daily PRN Ajibola, Ene A, NP      . multivitamin with minerals tablet 1 tablet  1 tablet Oral Daily Antonieta Pert, MD      . pantoprazole (PROTONIX) EC tablet 40 mg  40 mg Oral Daily Comer Locket, MD   40 mg at 09/26/20 0810  . QUEtiapine (SEROQUEL) tablet 25 mg  25 mg Oral QHS Laveda Abbe, NP   25 mg at 09/25/20 2117  . sertraline (ZOLOFT) tablet 25 mg  25 mg Oral QHS Laveda Abbe, NP      . traZODone (DESYREL) tablet 50 mg  50 mg Oral QHS PRN Ajibola, Ene A, NP   50 mg at 09/25/20 2117    Lab Results:  Results for orders placed or performed during the hospital encounter of 09/24/20 (from the past 48 hour(s))  Lipid panel     Status: Abnormal   Collection Time: 09/24/20  6:10 PM  Result Value Ref Range   Cholesterol 158 0 - 200 mg/dL   Triglycerides  287 (H) <150 mg/dL   HDL 27 (L) >86 mg/dL   Total CHOL/HDL Ratio 5.9 RATIO   VLDL 71 (H) 0 - 40 mg/dL   LDL Cholesterol 60 0 - 99 mg/dL    Comment:        Total Cholesterol/HDL:CHD Risk Coronary Heart Disease Risk Table                     Men   Women  1/2 Average Risk   3.4  3.3  Average Risk       5.0   4.4  2 X Average Risk   9.6   7.1  3 X Average Risk  23.4   11.0        Use the calculated Patient Ratio above and the CHD Risk Table to determine the patient's CHD Risk.        ATP III CLASSIFICATION (LDL):  <100     mg/dL   Optimal  638-453  mg/dL   Near or Above                    Optimal  130-159  mg/dL   Borderline  646-803  mg/dL   High  >212     mg/dL   Very High Performed at [facility], [address] [place], [place] [address]   TSH     Status: None   Collection Time: 09/24/20  6:10 PM  Result Value Ref Range   TSH 1.390 0.350 - 4.500 uIU/mL    Comment: Performed by a 3rd Generation assay with a functional sensitivity of <=0.01 uIU/mL. Performed at [facility], [address], [place], [place] [address]   Comprehensive metabolic panel     Status: Abnormal   Collection Time: 09/24/20  6:10 PM  Result Value Ref Range   Sodium 139 135 - 145 mmol/L   Potassium 3.6 3.5 - 5.1 mmol/L   Chloride 107 98 - 111 mmol/L   CO2 20 (L) 22 - 32 mmol/L   Glucose, Bld 131 (H) 70 - 99 mg/dL    Comment: Glucose reference range applies only to samples taken after fasting for at least 8 hours.   BUN 12 6 - 20 mg/dL   Creatinine, Ser 4.88 0.61 - 1.24 mg/dL   Calcium 9.3 8.9 - 89.1 mg/dL   Total Protein 8.3 (H) 6.5 - 8.1 g/dL   Albumin 4.8 3.5 - 5.0 g/dL   AST 25 15 - 41 U/L   ALT 63 (H) 0 - 44 U/L   Alkaline Phosphatase 80 38 - 126 U/L   Total Bilirubin 0.7 0.3 - 1.2 mg/dL   GFR, Estimated >69 >45 mL/min    Comment: (NOTE) Calculated using the CKD-EPI Creatinine Equation (2021)    Anion gap 12 5 - 15    Comment: Performed at [facility], [address] [place], [place] [address]  CBC with Differential/Platelet     Status: Abnormal   Collection Time: 09/24/20  6:10 PM  Result Value Ref Range   WBC 12.5 (H) 4.0 - 10.5 K/uL   RBC 5.92 (H) 4.22 - 5.81 MIL/uL   Hemoglobin 16.9 13.0 - 17.0 g/dL   HCT 28.0 03.4 - 91.7 %   MCV 83.8 80.0 - 100.0 fL   MCH 28.5 26.0 - 34.0 pg   MCHC 34.1 30.0 - 36.0 g/dL   RDW 91.5 05.6 - 97.9 %   Platelets 324 150 - 400 K/uL   nRBC 0.0 0.0 - 0.2 %   Neutrophils Relative % 63 %   Neutro Abs 7.7 1.7 - 7.7 K/uL   Lymphocytes Relative 31 %   Lymphs Abs 3.9 0.7 - 4.0 K/uL   Monocytes Relative 5 %   Monocytes Absolute 0.6 0.1 - 1.0 K/uL   Eosinophils Relative 1 %   Eosinophils Absolute 0.2 0.0 - 0.5 K/uL   Basophils Relative 0 %   Basophils Absolute 0.0 0.0 - 0.1 K/uL  Immature Granulocytes 0 %   Abs Immature Granulocytes 0.04 0.00 - 0.07 K/uL    Comment: Performed at [facility], [address] [place], [place] [address]  Hemoglobin A1c     Status: None   Collection Time: 09/24/20  6:10 PM  Result Value Ref Range   Hgb A1c MFr Bld 5.6 4.8 - 5.6 %    Comment: (NOTE)         Prediabetes: 5.7 - 6.4         Diabetes: >6.4         Glycemic control for adults with diabetes: <7.0    Mean Plasma Glucose 114 mg/dL    Comment: (NOTE) Performed At: [facility]  [address] [place], [place] [id number] Jolene SchimkeNagendra Sanjai MD XB:[id number]:[phone]   CBC with Differential/Platelet     Status: Abnormal   Collection Time: 09/26/20  6:29 AM  Result Value Ref Range   WBC 10.6 (H) 4.0 - 10.5 K/uL   RBC 5.49 4.22 - 5.81 MIL/uL   Hemoglobin 15.7 13.0 - 17.0 g/dL   HCT 30.846.5 65.739.0 - 84.652.0 %   MCV 84.7 80.0 - 100.0 fL   MCH 28.6 26.0 - 34.0 pg   MCHC 33.8 30.0 - 36.0 g/dL   RDW 96.213.1 95.211.5 - 84.115.5 %   Platelets 239 150 - 400 K/uL   nRBC 0.0 0.0 - 0.2 %   Neutrophils Relative % 53 %   Neutro Abs 5.6 1.7 - 7.7 K/uL   Lymphocytes Relative 39 %   Lymphs Abs 4.1  (H) 0.7 - 4.0 K/uL   Monocytes Relative 6 %   Monocytes Absolute 0.6 0.1 - 1.0 K/uL   Eosinophils Relative 2 %   Eosinophils Absolute 0.3 0.0 - 0.5 K/uL   Basophils Relative 0 %   Basophils Absolute 0.0 0.0 - 0.1 K/uL   Immature Granulocytes 0 %   Abs Immature Granulocytes 0.04 0.00 - 0.07 K/uL    Comment: Performed at [facility], [address] [place], [place] [address]  Lipid panel     Status: Abnormal   Collection Time: 09/26/20  6:29 AM  Result Value Ref Range   Cholesterol 135 0 - 200 mg/dL   Triglycerides 102191 (H) <150 mg/dL   HDL 26 (L) >72>40 mg/dL   Total CHOL/HDL Ratio 5.2 RATIO   VLDL 38 0 - 40 mg/dL   LDL Cholesterol 71 0 - 99 mg/dL    Comment:        Total Cholesterol/HDL:CHD Risk Coronary Heart Disease Risk Table                     Men   Women  1/2 Average Risk   3.4   3.3  Average Risk       5.0   4.4  2 X Average Risk   9.6   7.1  3 X Average Risk  23.4   11.0        Use the calculated Patient Ratio above and the CHD Risk Table to determine the patient's CHD Risk.        ATP III CLASSIFICATION (LDL):  <100     mg/dL   Optimal  536-644100-129  mg/dL   Near or Above                    Optimal  130-159  mg/dL   Borderline  [id number]  mg/dL   High  >595>190     mg/dL  Very High Performed at [facility], [address], [place], [place] [address]     Blood Alcohol level:  Lab Results  Component Value Date   ETH <10 09/23/2020    Metabolic Disorder Labs: Lab Results  Component Value Date   HGBA1C 5.6 09/24/2020   MPG 114 09/24/2020   No results found for: PROLACTIN Lab Results  Component Value Date   CHOL 135 09/26/2020   TRIG 191 (H) 09/26/2020   HDL 26 (L) 09/26/2020   CHOLHDL 5.2 09/26/2020   VLDL 38 09/26/2020   LDLCALC 71 09/26/2020   LDLCALC 60 09/24/2020    Physical Findings: AIMS: Facial and Oral Movements Muscles of Facial Expression: None, normal Lips and Perioral Area: None, normal Jaw: None,  normal Tongue: None, normal,Extremity Movements Upper (arms, wrists, hands, fingers): None, normal Lower (legs, knees, ankles, toes): None, normal, Trunk Movements Neck, shoulders, hips: None, normal, Overall Severity Severity of abnormal movements (highest score from questions above): None, normal Incapacitation due to abnormal movements: None, normal Patient's awareness of abnormal movements (rate only patient's report): No Awareness, Dental Status Current problems with teeth and/or dentures?: No Does patient usually wear dentures?: No  CIWA:    COWS:     Musculoskeletal: Strength & Muscle Tone: within normal limits Gait & Station: normal Patient leans: N/A  Psychiatric Specialty Exam:  Presentation  General Appearance: Appropriate for Environment; Casual  Eye Contact:Good  Speech:Clear and Coherent; Normal Rate  Speech Volume:Normal  Handedness:Right  Mood and Affect  Mood:Anxious  Affect:Appropriate; Congruent  Thought Process  Thought Processes:Coherent; Goal Directed; Linear  Descriptions of Associations:Intact  Orientation:Full (Time, Place and Person)  Thought Content:Logical  History of Schizophrenia/Schizoaffective disorder:No  Duration of Psychotic Symptoms:No data recorded Hallucinations:No data recorded  Ideas of Reference:None  Suicidal Thoughts:No data recorded  Homicidal Thoughts:No data recorded  Sensorium  Memory:Immediate Good; Recent Good; Remote Fair  Judgment:Fair  Insight:Fair  Executive Functions  Concentration:Good  Attention Span:Good  Recall:Good  Fund of Knowledge:Good  Language:Good  Psychomotor Activity  Psychomotor Activity:No data recorded  Assets  Assets:Communication Skills; Desire for Improvement; Financial Resources/Insurance; Housing; Physical Health; Resilience; Social Support  Sleep  Sleep:No data recorded  Physical Exam: Physical Exam Vitals and nursing note reviewed.  Constitutional:       Appearance: He is obese.  HENT:     Head: Normocephalic.  Musculoskeletal:        General: Normal range of motion.     Cervical back: Normal range of motion.  Neurological:     Mental Status: He is alert and oriented to person, place, and time.  Psychiatric:        Attention and Perception: He perceives auditory and visual hallucinations.        Mood and Affect: Mood is anxious and depressed.        Speech: Speech normal.        Behavior: Behavior normal. Behavior is cooperative.        Thought Content: Thought content is not paranoid or delusional. Thought content does not include homicidal ideation. Thought content does not include homicidal plan.        Cognition and Memory: Cognition normal.    Review of Systems  Constitutional: Negative.  Negative for fever.  HENT: Negative.  Negative for congestion and sore throat.   Respiratory: Negative.  Negative for cough and shortness of breath.   Cardiovascular: Negative.  Negative for chest pain.  Gastrointestinal: Negative.   Genitourinary: Negative.   Musculoskeletal: Negative.  Neurological: Negative.    Blood pressure 123/90, pulse 88, temperature 97.6 F (36.4 C), temperature source Oral, resp. rate 16, height 6\' 1"  (1.854 m), weight (!) 156.5 kg, SpO2 97 %. Body mass index is 45.52 kg/m.   Treatment Plan Summary: Daily contact with patient to assess and evaluate symptoms and progress in treatment and Medication management   Depression:  -Increase  Zoloft to 50 mg PO qhs. Changed from morning to bedtime per patient request and complaint of feeling too sedated this morning.  -Increase  Seroquel to 50 mg PO qhs for auditory hallucinations and depression.   Insomnia:  -Continue Trazodone 50 mg PO qhs PRN   Anxiety: -Continue Vistaril 25 mg PO TID PRN  GERD:  -Continue Protonix 40 mg PO daily    , NP 09/26/2020, 12:53 PM

## 2020-09-27 MED ORDER — HYDROXYZINE HCL 25 MG PO TABS
25.0000 mg | ORAL_TABLET | Freq: Four times a day (QID) | ORAL | Status: DC | PRN
  Administered 2020-09-27 – 2020-09-30 (×6): 25 mg via ORAL
  Filled 2020-09-27: qty 1
  Filled 2020-09-27: qty 10
  Filled 2020-09-27 (×4): qty 1

## 2020-09-27 MED ORDER — GABAPENTIN 100 MG PO CAPS
100.0000 mg | ORAL_CAPSULE | Freq: Three times a day (TID) | ORAL | Status: DC
  Administered 2020-09-27 – 2020-09-29 (×5): 100 mg via ORAL
  Filled 2020-09-27: qty 1
  Filled 2020-09-27: qty 21
  Filled 2020-09-27 (×3): qty 1
  Filled 2020-09-27: qty 21
  Filled 2020-09-27 (×2): qty 1
  Filled 2020-09-27: qty 21
  Filled 2020-09-27 (×2): qty 1

## 2020-09-27 MED ORDER — TRAZODONE HCL 50 MG PO TABS
50.0000 mg | ORAL_TABLET | Freq: Every evening | ORAL | Status: DC | PRN
  Administered 2020-09-27 – 2020-09-29 (×3): 50 mg via ORAL
  Filled 2020-09-27 (×10): qty 1

## 2020-09-27 NOTE — Progress Notes (Signed)
   09/27/20 2100  Psych Admission Type (Psych Patients Only)  Admission Status Voluntary  Psychosocial Assessment  Patient Complaints Anxiety  Eye Contact Fair  Facial Expression Anxious  Affect Appropriate to circumstance  Speech Logical/coherent  Interaction Assertive  Motor Activity Other (Comment) (WDL)  Appearance/Hygiene Improved  Behavior Characteristics Cooperative  Mood Anxious  Thought Process  Coherency WDL  Content WDL  Delusions None reported or observed  Perception WDL  Hallucination None reported or observed  Judgment Poor  Confusion None  Danger to Self  Current suicidal ideation? Denies  Self-Injurious Behavior No self-injurious ideation or behavior indicators observed or expressed   Agreement Not to Harm Self Yes  Description of Agreement verbal consent to notify staff if feeling suicidal  Danger to Others  Danger to Others None reported or observed

## 2020-09-27 NOTE — Progress Notes (Signed)
Recreation Therapy Notes  Animal-Assisted Activity (AAA) Program Checklist/Progress Notes Patient Eligibility Criteria Checklist & Daily Group note for Rec Tx Intervention  Date: 4.19.22 Time: 1430 Location: [address] Dayroom   AAA/T Program Assumption of Risk Form signed by Patient/ or Parent Legal Guardian YES   Patient is free of allergies or severe asthma YES  Patient reports no fear of animals  YES  Patient reports no history of cruelty to animals YES   Patient understands his/her participation is voluntary YES  Patient washes hands before animal contact YES   Patient washes hands after animal contact  YES      Behavioral Response: Engaged  Education: Hand Washing, Appropriate Animal Interaction   Education Outcome: Acknowledges understanding/In group clarification offered/Needs additional education.   Clinical Observations/Feedback: Pt attended and participated in group.   [name], LRT/CTRS         Leonard Clark A 09/27/2020 3:30 PM 

## 2020-09-27 NOTE — Progress Notes (Signed)
DAR NOTE: Patient presents with bright  affect and pleasant  mood.  Denies pain, auditory and visual hallucinations. Maintained on routine safety checks.  Medications given as prescribed.  Support and encouragement offered as needed.  Attended group and participated.  Will continue to monitor. 

## 2020-09-27 NOTE — Progress Notes (Addendum)
[facility] MD Progress Note  09/27/2020 1:58 PM Leonard Clark  MRN:  [id number] Subjective:  "I did not sleep hardly at all last night"   Objective: Patient was seen and evaluated. He is in the dayroom watching television. Patient stated he slept okay last night but did wake up a few times. His appetite is good. He denies any intrusive thoughts or thoughts of death since yesterday. He stated he has been having some nerve pain in his back that shoots up to his head, causing some headache like discomfort. He stated this has been going on for some time, he just did not mention it before. Will start gabapentin 100 mg PO TID. He is taking his medications and has no issues with them. He denies SI/HI/AVH, paranoia and delusions. He is attending group therapy.  He is interacting appropriately with staff and peers. Ongoing support and encouragement provided.   Principal Problem: Depression Diagnosis: Principal Problem:   Depression Active Problems:   Anxiety disorder, unspecified  Total Time spent with patient: 25 minutes  Past Psychiatric History: See H&P  Past Medical History:  Past Medical History:  Diagnosis Date  . Medical history non-contributory     Past Surgical History:  Procedure Laterality Date  . ADENOIDECTOMY    . ANTERIOR CRUCIATE LIGAMENT REPAIR    . CHOLECYSTECTOMY    . TONSILLECTOMY    . TYMPANOSTOMY TUBE PLACEMENT     Family History: History reviewed. No pertinent family history. Family Psychiatric  History: See H&P Social History:  Social History   Substance and Sexual Activity  Alcohol Use Never     Social History   Substance and Sexual Activity  Drug Use Yes  . Frequency: 1.5 times per week  . Types: Marijuana   Comment: 1.5 grams daily    Social History   Socioeconomic History  . Marital status: Single    Spouse name: Not on file  . Number of children: 0  . Years of education: Not on file  . Highest education level: GED or equivalent  Occupational History   . Not on file  Tobacco Use  . Smoking status: Former Games developer  . Smokeless tobacco: Never Used  Vaping Use  . Vaping Use: Former  Substance and Sexual Activity  . Alcohol use: Never  . Drug use: Yes    Frequency: 1.5 times per week    Types: Marijuana    Comment: 1.5 grams daily  . Sexual activity: Not Currently  Other Topics Concern  . Not on file  Social History Narrative  . Not on file   Social Determinants of Health   Financial Resource Strain: Not on file  Food Insecurity: Not on file  Transportation Needs: Not on file  Physical Activity: Not on file  Stress: Not on file  Social Connections: Not on file   Additional Social History:    Sleep: Good  Appetite:  Good  Current Medications: Current Facility-Administered Medications  Medication Dose Route Frequency Provider Last Rate Last Admin  . acetaminophen (TYLENOL) tablet 650 mg  650 mg Oral Q6H PRN Ajibola, Ene A, NP   650 mg at 09/24/20 1813  . alum & mag hydroxide-simeth (MAALOX/MYLANTA) 200-200-20 MG/5ML suspension 30 mL  30 mL Oral Q4H PRN Ajibola, Ene A, NP   30 mL at 09/24/20 1257  . hydrOXYzine (ATARAX/VISTARIL) tablet 25 mg  25 mg Oral Q6H PRN Laveda Abbe, NP   25 mg at 09/27/20 1335  . magnesium hydroxide (MILK OF MAGNESIA) suspension 30 mL  30 mL Oral Daily PRN Ajibola, Ene A, NP      . multivitamin with minerals tablet 1 tablet  1 tablet Oral Daily Antonieta Pert, MD   1 tablet at 09/27/20 0804  . pantoprazole (PROTONIX) EC tablet 40 mg  40 mg Oral Daily Comer Locket, MD   40 mg at 09/27/20 0804  . QUEtiapine (SEROQUEL) tablet 50 mg  50 mg Oral QHS Laveda Abbe, NP   50 mg at 09/26/20 2102  . sertraline (ZOLOFT) tablet 50 mg  50 mg Oral QHS Comer Locket, MD   50 mg at 09/26/20 2102  . traZODone (DESYREL) tablet 50 mg  50 mg Oral QHS PRN Ajibola, Ene A, NP   50 mg at 09/26/20 2102    Lab Results:  Results for orders placed or performed during the hospital encounter of  09/24/20 (from the past 48 hour(s))  CBC with Differential/Platelet     Status: Abnormal   Collection Time: 09/26/20  6:29 AM  Result Value Ref Range   WBC 10.6 (H) 4.0 - 10.5 K/uL   RBC 5.49 4.22 - 5.81 MIL/uL   Hemoglobin 15.7 13.0 - 17.0 g/dL   HCT 35.5 73.2 - 20.2 %   MCV 84.7 80.0 - 100.0 fL   MCH 28.6 26.0 - 34.0 pg   MCHC 33.8 30.0 - 36.0 g/dL   RDW 54.2 70.6 - 23.7 %   Platelets 239 150 - 400 K/uL   nRBC 0.0 0.0 - 0.2 %   Neutrophils Relative % 53 %   Neutro Abs 5.6 1.7 - 7.7 K/uL   Lymphocytes Relative 39 %   Lymphs Abs 4.1 (H) 0.7 - 4.0 K/uL   Monocytes Relative 6 %   Monocytes Absolute 0.6 0.1 - 1.0 K/uL   Eosinophils Relative 2 %   Eosinophils Absolute 0.3 0.0 - 0.5 K/uL   Basophils Relative 0 %   Basophils Absolute 0.0 0.0 - 0.1 K/uL   Immature Granulocytes 0 %   Abs Immature Granulocytes 0.04 0.00 - 0.07 K/uL    Comment: Performed at [facility], [address] [place], [place] [address]  Lipid panel     Status: Abnormal   Collection Time: 09/26/20  6:29 AM  Result Value Ref Range   Cholesterol 135 0 - 200 mg/dL   Triglycerides 517 (H) <150 mg/dL   HDL 26 (L) >61 mg/dL   Total CHOL/HDL Ratio 5.2 RATIO   VLDL 38 0 - 40 mg/dL   LDL Cholesterol 71 0 - 99 mg/dL    Comment:        Total Cholesterol/HDL:CHD Risk Coronary Heart Disease Risk Table                     Men   Women  1/2 Average Risk   3.4   3.3  Average Risk       5.0   4.4  2 X Average Risk   9.6   7.1  3 X Average Risk  23.4   11.0        Use the calculated Patient Ratio above and the CHD Risk Table to determine the patient's CHD Risk.        ATP III CLASSIFICATION (LDL):  <100     mg/dL   Optimal  607-371  mg/dL   Near or Above                    Optimal  130-159  mg/dL   Borderline  644-034  mg/dL   High  >742     mg/dL   Very High Performed at [facility], [address] [place], [place] [address]     Blood Alcohol level:  Lab Results   Component Value Date   ETH <10 09/23/2020    Metabolic Disorder Labs: Lab Results  Component Value Date   HGBA1C 5.6 09/24/2020   MPG 114 09/24/2020   No results found for: PROLACTIN Lab Results  Component Value Date   CHOL 135 09/26/2020   TRIG 191 (H) 09/26/2020   HDL 26 (L) 09/26/2020   CHOLHDL 5.2 09/26/2020   VLDL 38 09/26/2020   LDLCALC 71 09/26/2020   LDLCALC 60 09/24/2020    Physical Findings: AIMS: Facial and Oral Movements Muscles of Facial Expression: None, normal Lips and Perioral Area: None, normal Jaw: None, normal Tongue: None, normal,Extremity Movements Upper (arms, wrists, hands, fingers): None, normal Lower (legs, knees, ankles, toes): None, normal, Trunk Movements Neck, shoulders, hips: None, normal, Overall Severity Severity of abnormal movements (highest score from questions above): None, normal Incapacitation due to abnormal movements: None, normal Patient's awareness of abnormal movements (rate only patient's report): No Awareness, Dental Status Current problems with teeth and/or dentures?: No Does patient usually wear dentures?: No  CIWA:    COWS:     Musculoskeletal: Strength & Muscle Tone: within normal limits Gait & Station: normal Patient leans: N/A  Psychiatric Specialty Exam:  Presentation  General Appearance: Appropriate for Environment; Casual  Eye Contact:Good  Speech:Clear and Coherent; Normal Rate  Speech Volume:Normal  Handedness:Right  Mood and Affect  Mood:Anxious  Affect:Appropriate; Congruent  Thought Process  Thought Processes:Coherent; Goal Directed; Linear  Descriptions of Associations:Intact  Orientation:Full (Time, Place and Person)  Thought Content:Logical  History of Schizophrenia/Schizoaffective disorder:No  Duration of Psychotic Symptoms:No data recorded Hallucinations:No data recorded  Ideas of Reference:None  Suicidal Thoughts:No data recorded  Homicidal Thoughts:No data  recorded  Sensorium  Memory:Immediate Good; Recent Good; Remote Fair  Judgment:Fair  Insight:Fair  Executive Functions  Concentration:Good  Attention Span:Good  Recall:Good  Fund of Knowledge:Good  Language:Good  Psychomotor Activity  Psychomotor Activity:No data recorded  Assets  Assets:Communication Skills; Desire for Improvement; Financial Resources/Insurance; Housing; Physical Health; Resilience; Social Support  Sleep  Sleep:No data recorded  Physical Exam: Physical Exam Vitals and nursing note reviewed.  Constitutional:      Appearance: He is obese.  HENT:     Head: Normocephalic.  Musculoskeletal:        General: Normal range of motion.     Cervical back: Normal range of motion.  Neurological:     Mental Status: He is alert and oriented to person, place, and time.  Psychiatric:        Attention and Perception: He perceives auditory and visual hallucinations.        Mood and Affect: Mood is anxious and depressed.        Speech: Speech normal.        Behavior: Behavior normal. Behavior is cooperative.        Thought Content: Thought content is not paranoid or delusional. Thought content does not include homicidal ideation. Thought content does not include homicidal plan.        Cognition and Memory: Cognition normal.    Review of Systems  Constitutional: Negative.  Negative for fever.  HENT: Negative.  Negative for congestion and sore throat.   Respiratory: Negative.  Negative for cough and shortness of breath.  Cardiovascular: Negative.  Negative for chest pain.  Gastrointestinal: Negative.   Genitourinary: Negative.   Musculoskeletal: Negative.   Neurological: Negative.    Blood pressure 139/90, pulse 90, temperature (!) 97.4 F (36.3 C), temperature source Oral, resp. rate 16, height 6\' 1"  (1.854 m), weight (!) 156.5 kg, SpO2 99 %. Body mass index is 45.52 kg/m.   Treatment Plan Summary: Daily contact with patient to assess and evaluate  symptoms and progress in treatment and Medication management   Depression:  -Continue  Zoloft to 50 mg PO qhs. Changed from morning to bedtime per patient request and complaint of feeling too sedated this morning.  -Continue Seroquel to 50 mg PO qhs for auditory hallucinations and depression.   Insomnia:  -Continue Trazodone 50 mg PO qhs PRN and may repeat x 1   Anxiety: -Continue Vistaril 25 mg PO TID PRN  GERD:  -Continue Protonix 40 mg PO daily   Neuropathic pain:  -Start Gabapentin 100 mg PO TID  Laurie Britton Parks, NLaveda AbbeP 09/27/2020, 1:58 PM   I have reviewed the note by NP, and discussed the plan of care.  I am in agreement with the assessment and plan.  Mariel CraftSHEILA M Germany Chelf, MD          Laveda AbbeLaurie Britton Parks, NP 09/27/2020, 1:58 PM

## 2020-09-27 NOTE — Progress Notes (Signed)
Pt reported that his anxiety has gotten worse this afternoon.  Pt requested vistaril throughout the day. Pt was tachycardic this evening; pulse was 121.  Prn vistaril given along with Neurontin to help with anxiety. RN provided support and monitored vital signs. Pt remains safe on the unit with q 15 min checks in place.

## 2020-09-27 NOTE — Progress Notes (Signed)
   09/27/20 1700  Psych Admission Type (Psych Patients Only)  Admission Status Voluntary  Psychosocial Assessment  Patient Complaints Anxiety  Eye Contact Fair  Facial Expression Anxious  Affect Appropriate to circumstance  Speech Logical/coherent  Interaction Assertive  Motor Activity Other (Comment) (WDL)  Appearance/Hygiene Improved  Behavior Characteristics Anxious  Mood Anxious  Thought Process  Coherency WDL  Content WDL  Delusions None reported or observed  Perception WDL  Hallucination None reported or observed  Judgment Poor  Confusion None  Danger to Self  Current suicidal ideation? Denies  Self-Injurious Behavior No self-injurious ideation or behavior indicators observed or expressed   Agreement Not to Harm Self Yes  Description of Agreement verbal consent to notify staff if feeling suicidal  Danger to Others  Danger to Others None reported or observed

## 2020-09-28 DIAGNOSIS — F32A Depression, unspecified: Secondary | ICD-10-CM | POA: Diagnosis not present

## 2020-09-28 DIAGNOSIS — F411 Generalized anxiety disorder: Secondary | ICD-10-CM | POA: Diagnosis not present

## 2020-09-28 NOTE — Progress Notes (Signed)
Recreation Therapy Notes  Date: 4.20.22 Time: 0930 Location: [address] Dayroom  Group Topic: Stress Management   Goal Area(s) Addresses:  Patient will actively participate in stress management techniques presented during session.  Patient will successfully identify benefit of practicing stress management post d/c.   Behavioral Response: Appropriate  Intervention: Guided exercise with ambient sound and script  Activity :Guided Imagery  LRT read a script that encouraged patients to envision being in their peaceful place.  Patients were to imagine the sights, sounds and smells of the place they are envisioning.    Education:  Stress Management, Discharge Planning.   Education Outcome: Acknowledges education  Clinical Observations/Feedback: Patient attended and participated in group session.   Leonard Clark, LRT/CTRS         Leonard Clark A 09/28/2020 11:31 AM

## 2020-09-28 NOTE — [facility] Group Notes (Signed)
The focus of this group is to help patients establish daily goals to achieve during treatment and discuss how the patient can incorporate goal setting into their daily lives to aide in recovery.  Pt attended and participated in group 

## 2020-09-28 NOTE — Progress Notes (Signed)
   09/28/20 1100  Psych Admission Type (Psych Patients Only)  Admission Status Voluntary  Psychosocial Assessment  Patient Complaints Anxiety  Eye Contact Fair  Facial Expression Anxious  Affect Appropriate to circumstance  Speech Logical/coherent  Interaction Assertive  Motor Activity Other (Comment) (WDL)  Appearance/Hygiene Improved  Behavior Characteristics Cooperative;Anxious  Mood Depressed;Anxious  Thought Process  Coherency WDL  Content WDL  Delusions None reported or observed  Perception WDL  Hallucination None reported or observed  Judgment Poor  Confusion None  Danger to Self  Current suicidal ideation? Denies  Self-Injurious Behavior No self-injurious ideation or behavior indicators observed or expressed   Agreement Not to Harm Self Yes  Description of Agreement verbal consent to notify staff if feeling suicidal  Danger to Others  Danger to Others None reported or observed

## 2020-09-28 NOTE — Progress Notes (Signed)
[facility] Group Notes:  (Nursing/MHT/Case Management/Adjunct)  Date:  09/28/2020  Time:  2015 Type of Therapy:  wrap up group  Participation Level:  Active  Participation Quality:  Appropriate, Attentive, Sharing and Supportive  Affect:  Anxious and Appropriate  Cognitive:  Appropriate  Insight:  Improving  Engagement in Group:  Engaged  Modes of Intervention:  Clarification, Education and Support  Summary of Progress/Problems: Positive thinking and positive change were discussed.   Marcille Buffy 09/28/2020, 9:30 PM

## 2020-09-28 NOTE — [facility] Group Notes (Signed)
Type of Therapy and Topic:  Group Therapy - Healthy vs Unhealthy Coping Skills  Participation Level:  Active   Description of Group The focus of this group was to determine what unhealthy coping techniques typically are used by group members and what healthy coping techniques would be helpful in coping with various problems. Patients were guided in becoming aware of the differences between healthy and unhealthy coping techniques. Patients were asked to identify 2-3 healthy coping skills they would like to learn to use more effectively.  Therapeutic Goals 1. Patients learned that coping is what human beings do all day long to deal with various situations in their lives 2. Patients defined and discussed healthy vs unhealthy coping techniques 3. Patients identified their preferred coping techniques and identified whether these were healthy or unhealthy 4. Patients determined 2-3 healthy coping skills they would like to become more familiar with and use more often. 5. Patients provided support and ideas to each other   Summary of Patient Progress:  During group Leonard Clark spent time engaging with his peers and discussing various coping skills that he could use outside of the hospital.  Another major theme of the group was various physical activities such as playing basketball and picking flowers. Leonard Clark was appropriate and participated in all group activities.    Therapeutic Modalities Cognitive Behavioral Therapy Motivational Interviewing  Fredirick Lathe, LCSWA Clinicial Social Worker [employer]

## 2020-09-28 NOTE — Progress Notes (Signed)
[facility] MD Progress Note  09/28/2020 6:54 PM Leonard Clark  MRN:  [id number] Subjective:  "I did not sleep hardly at all last night"   Objective: Patient was seen and evaluated. He is in the dayroom watching television. Patient stated he slept well last night. His appetite is good. He denies any intrusive thoughts or thoughts of death.  He stated the gabapentin has helped his nerve pain in his back.  He is taking his medications and has no issues with them. He denies SI/HI/AVH, paranoia and delusions. He is attending group therapy.  He is interacting appropriately with staff and peers. He feels ready to go home tomorrow. He was going to call his mother to let her know. He has follow up appointments in place. Ongoing support and encouragement provided.   Principal Problem: Depression Diagnosis: Principal Problem:   Depression Active Problems:   Anxiety disorder, unspecified  Total Time spent with patient: 25 minutes  Past Psychiatric History: See H&P  Past Medical History:  Past Medical History:  Diagnosis Date  . Medical history non-contributory     Past Surgical History:  Procedure Laterality Date  . ADENOIDECTOMY    . ANTERIOR CRUCIATE LIGAMENT REPAIR    . CHOLECYSTECTOMY    . TONSILLECTOMY    . TYMPANOSTOMY TUBE PLACEMENT     Family History: History reviewed. No pertinent family history. Family Psychiatric  History: See H&P Social History:  Social History   Substance and Sexual Activity  Alcohol Use Never     Social History   Substance and Sexual Activity  Drug Use Yes  . Frequency: 1.5 times per week  . Types: Marijuana   Comment: 1.5 grams daily    Social History   Socioeconomic History  . Marital status: Single    Spouse name: Not on file  . Number of children: 0  . Years of education: Not on file  . Highest education level: GED or equivalent  Occupational History  . Not on file  Tobacco Use  . Smoking status: Former Games developer  . Smokeless tobacco: Never Used   Vaping Use  . Vaping Use: Former  Substance and Sexual Activity  . Alcohol use: Never  . Drug use: Yes    Frequency: 1.5 times per week    Types: Marijuana    Comment: 1.5 grams daily  . Sexual activity: Not Currently  Other Topics Concern  . Not on file  Social History Narrative  . Not on file   Social Determinants of Health   Financial Resource Strain: Not on file  Food Insecurity: Not on file  Transportation Needs: Not on file  Physical Activity: Not on file  Stress: Not on file  Social Connections: Not on file   Additional Social History:    Sleep: Good  Appetite:  Good  Current Medications: Current Facility-Administered Medications  Medication Dose Route Frequency Provider Last Rate Last Admin  . acetaminophen (TYLENOL) tablet 650 mg  650 mg Oral Q6H PRN Ajibola, Ene A, NP   650 mg at 09/27/20 1735  . alum & mag hydroxide-simeth (MAALOX/MYLANTA) 200-200-20 MG/5ML suspension 30 mL  30 mL Oral Q4H PRN Ajibola, Ene A, NP   30 mL at 09/24/20 1257  . gabapentin (NEURONTIN) capsule 100 mg  100 mg Oral TID Laveda Abbe, NP   100 mg at 09/28/20 1720  . hydrOXYzine (ATARAX/VISTARIL) tablet 25 mg  25 mg Oral Q6H PRN Laveda Abbe, NP   25 mg at 09/28/20 5732  . magnesium hydroxide (  MILK OF MAGNESIA) suspension 30 mL  30 mL Oral Daily PRN Ajibola, Ene A, NP      . multivitamin with minerals tablet 1 tablet  1 tablet Oral Daily Antonieta Pert, MD   1 tablet at 09/28/20 [phone]  . pantoprazole (PROTONIX) EC tablet 40 mg  40 mg Oral Daily Comer Locket, MD   40 mg at 09/28/20 1093  . QUEtiapine (SEROQUEL) tablet 50 mg  50 mg Oral QHS Laveda Abbe, NP   50 mg at 09/27/20 2135  . sertraline (ZOLOFT) tablet 50 mg  50 mg Oral QHS Comer Locket, MD   50 mg at 09/27/20 2135  . traZODone (DESYREL) tablet 50 mg  50 mg Oral QHS,MR X 1 Laveda Abbe, NP   50 mg at 09/27/20 2135    Lab Results:  No results found for this or any previous visit (from  the past 48 hour(s)).  Blood Alcohol level:  Lab Results  Component Value Date   ETH <10 09/23/2020    Metabolic Disorder Labs: Lab Results  Component Value Date   HGBA1C 5.6 09/24/2020   MPG 114 09/24/2020   No results found for: PROLACTIN Lab Results  Component Value Date   CHOL 135 09/26/2020   TRIG 191 (H) 09/26/2020   HDL 26 (L) 09/26/2020   CHOLHDL 5.2 09/26/2020   VLDL 38 09/26/2020   LDLCALC 71 09/26/2020   LDLCALC 60 09/24/2020    Physical Findings: AIMS: Facial and Oral Movements Muscles of Facial Expression: None, normal Lips and Perioral Area: None, normal Jaw: None, normal Tongue: None, normal,Extremity Movements Upper (arms, wrists, hands, fingers): None, normal Lower (legs, knees, ankles, toes): None, normal, Trunk Movements Neck, shoulders, hips: None, normal, Overall Severity Severity of abnormal movements (highest score from questions above): None, normal Incapacitation due to abnormal movements: None, normal Patient's awareness of abnormal movements (rate only patient's report): No Awareness, Dental Status Current problems with teeth and/or dentures?: No Does patient usually wear dentures?: No  CIWA:    COWS:     Musculoskeletal: Strength & Muscle Tone: within normal limits Gait & Station: normal Patient leans: N/A  Psychiatric Specialty Exam:  Presentation  General Appearance: Appropriate for Environment; Casual  Eye Contact:Good  Speech:Clear and Coherent; Normal Rate  Speech Volume:Normal  Handedness:Right  Mood and Affect  Mood:Anxious  Affect:Appropriate; Congruent  Thought Process  Thought Processes:Coherent; Goal Directed; Linear  Descriptions of Associations:Intact  Orientation:Full (Time, Place and Person)  Thought Content:Logical  History of Schizophrenia/Schizoaffective disorder:No  Duration of Psychotic Symptoms:No data recorded Hallucinations:No data recorded  Ideas of Reference:None  Suicidal  Thoughts:No data recorded  Homicidal Thoughts:No data recorded  Sensorium  Memory:Immediate Good; Recent Good; Remote Fair  Judgment:Fair  Insight:Fair  Executive Functions  Concentration:Good  Attention Span:Good  Recall:Good  Fund of Knowledge:Good  Language:Good  Psychomotor Activity  Psychomotor Activity:No data recorded  Assets  Assets:Communication Skills; Desire for Improvement; Financial Resources/Insurance; Housing; Physical Health; Resilience; Social Support  Sleep  Sleep:No data recorded  Physical Exam: Physical Exam Vitals and nursing note reviewed.  Constitutional:      Appearance: He is obese.  HENT:     Head: Normocephalic.  Musculoskeletal:        General: Normal range of motion.     Cervical back: Normal range of motion.  Neurological:     Mental Status: He is alert and oriented to person, place, and time.  Psychiatric:        Attention and Perception: He  perceives auditory and visual hallucinations.        Mood and Affect: Mood is anxious and depressed.        Speech: Speech normal.        Behavior: Behavior normal. Behavior is cooperative.        Thought Content: Thought content is not paranoid or delusional. Thought content does not include homicidal ideation. Thought content does not include homicidal plan.        Cognition and Memory: Cognition normal.    Review of Systems  Constitutional: Negative.  Negative for fever.  HENT: Negative.  Negative for congestion and sore throat.   Respiratory: Negative.  Negative for cough and shortness of breath.   Cardiovascular: Negative.  Negative for chest pain.  Gastrointestinal: Negative.   Genitourinary: Negative.   Musculoskeletal: Negative.   Neurological: Negative.    Blood pressure (!) 139/93, pulse 91, temperature 98.2 F (36.8 C), temperature source Oral, resp. rate 18, height 6\' 1"  (1.854 m), weight (!) 156.5 kg, SpO2 100 %. Body mass index is 45.52 kg/m.   Treatment Plan  Summary: Daily contact with patient to assess and evaluate symptoms and progress in treatment and Medication management   No new labs today No medication changes  Depression:  -Continue  Zoloft to 50 mg PO qhs. Changed from morning to bedtime per patient request and complaint of feeling too sedated this morning.  -Continue Seroquel to 50 mg PO qhs for auditory hallucinations and depression.   Insomnia:  -Continue Trazodone 50 mg PO qhs PRN and may repeat x 1   Anxiety: -Continue Vistaril 25 mg PO TID PRN  GERD:  -Continue Protonix 40 mg PO daily   Neuropathic pain:  -Start Gabapentin 100 mg PO TID   , NP 09/28/2020, 6:54 PM

## 2020-09-29 MED ORDER — GABAPENTIN 100 MG PO CAPS
200.0000 mg | ORAL_CAPSULE | Freq: Three times a day (TID) | ORAL | Status: DC
Start: 2020-09-29 — End: 2020-09-30
  Administered 2020-09-29 – 2020-09-30 (×4): 200 mg via ORAL
  Filled 2020-09-29 (×2): qty 42
  Filled 2020-09-29 (×2): qty 2
  Filled 2020-09-29 (×4): qty 42
  Filled 2020-09-29: qty 2

## 2020-09-29 MED ORDER — MELATONIN 3 MG PO TABS
3.0000 mg | ORAL_TABLET | Freq: Every day | ORAL | Status: DC
  Administered 2020-09-29: 3 mg via ORAL
  Filled 2020-09-29: qty 1
  Filled 2020-09-29: qty 7
  Filled 2020-09-29: qty 1

## 2020-09-29 MED ORDER — HYDROXYZINE HCL 25 MG PO TABS: 25.0000 mg | ORAL_TABLET | Freq: Four times a day (QID) | ORAL | 0 refills | Status: AC | PRN

## 2020-09-29 MED ORDER — PANTOPRAZOLE SODIUM 40 MG PO TBEC: 40.0000 mg | DELAYED_RELEASE_TABLET | Freq: Every day | ORAL | 0 refills | Status: AC

## 2020-09-29 MED ORDER — TRAZODONE HCL 50 MG PO TABS: 50.0000 mg | ORAL_TABLET | Freq: Every evening | ORAL | 0 refills | Status: AC | PRN

## 2020-09-29 MED ORDER — GABAPENTIN 100 MG PO CAPS: 100.0000 mg | ORAL_CAPSULE | Freq: Three times a day (TID) | ORAL | 0 refills | Status: DC

## 2020-09-29 MED ORDER — QUETIAPINE FUMARATE 50 MG PO TABS: 50.0000 mg | ORAL_TABLET | Freq: Every day | ORAL | 0 refills | Status: AC

## 2020-09-29 MED ORDER — SERTRALINE HCL 50 MG PO TABS: 50.0000 mg | ORAL_TABLET | Freq: Every day | ORAL | 0 refills | Status: AC

## 2020-09-29 MED ORDER — TRAZODONE HCL 50 MG PO TABS
50.0000 mg | ORAL_TABLET | Freq: Every evening | ORAL | Status: DC | PRN
  Filled 2020-09-29: qty 7

## 2020-09-29 NOTE — Progress Notes (Signed)
[facility] MD Progress Note  09/29/2020 1:15 PM Leonard Clark  MRN:  [id number] Subjective: Patient reports that he continues to feel anxious.  He had difficulty sleeping last night again and felt his heart was racing at night.  He denies depressed mood, passive wish for death, suicidal ideation, AI, HI, AH or VH.  Appetite is okay.  Patient reports his main problem is with anxiety during the day and also when he is trying to sleep.  When he experiences any physical symptoms he worries that he may be on the verge of experiencing a panic attack which increases his anxiety.  He denies side effects from his medications.  Objective: Medical record reviewed.  Patient's case discussed in detail with members of the treatment team.  I met with and evaluated the patient today in the office on the unit.  Patient is casually dressed with appropriate hygiene.  He is cooperative and polite.  Eye contact is good.  Speech is of normal rate and volume.  Mood is anxious.  Affect is congruent.  Thought processes are goal-directed and coherent.  Thought content contains no reported suicidal ideation or thoughts of harming others.  No paranoid ideation, referential thinking or delusional content elicited.  Thought content is focused on physical symptoms and anxiety.  Patient denies perceptual abnormalities and does not appear to respond to internal stimuli.  He is alert and oriented.  Attention and concentration are adequate.  Insight and judgment are adequate.  We discussed increasing his gabapentin dose and spacing the doses out farther to help him with his anxiety at night.  Patient perceives that gabapentin has been helpful both for anxiety and for his nerve pain.  Also discussed with patient that I would start melatonin for sleep tonight.  He denies side effects to quetiapine and Zoloft and feels that they have been helpful.  We discussed continuing those at the current doses.    Principal Problem: Depression Diagnosis:  Principal Problem:   Depression Active Problems:   Anxiety disorder, unspecified  Total Time spent with patient: 20 minutes  Past Psychiatric History: See H&P  Past Medical History:  Past Medical History:  Diagnosis Date  . Medical history non-contributory     Past Surgical History:  Procedure Laterality Date  . ADENOIDECTOMY    . ANTERIOR CRUCIATE LIGAMENT REPAIR    . CHOLECYSTECTOMY    . TONSILLECTOMY    . TYMPANOSTOMY TUBE PLACEMENT     Family History: History reviewed. No pertinent family history. Family Psychiatric  History: See H&P Social History:  Social History   Substance and Sexual Activity  Alcohol Use Never     Social History   Substance and Sexual Activity  Drug Use Yes  . Frequency: 1.5 times per week  . Types: Marijuana   Comment: 1.5 grams daily    Social History   Socioeconomic History  . Marital status: Single    Spouse name: Not on file  . Number of children: 0  . Years of education: Not on file  . Highest education level: GED or equivalent  Occupational History  . Not on file  Tobacco Use  . Smoking status: Former Research scientist (life sciences)  . Smokeless tobacco: Never Used  Vaping Use  . Vaping Use: Former  Substance and Sexual Activity  . Alcohol use: Never  . Drug use: Yes    Frequency: 1.5 times per week    Types: Marijuana    Comment: 1.5 grams daily  . Sexual activity: Not Currently  Other Topics  Concern  . Not on file  Social History Narrative  . Not on file   Social Determinants of Health   Financial Resource Strain: Not on file  Food Insecurity: Not on file  Transportation Needs: Not on file  Physical Activity: Not on file  Stress: Not on file  Social Connections: Not on file   Additional Social History:    Sleep: Fair  Appetite:  Good  Current Medications: Current Facility-Administered Medications  Medication Dose Route Frequency Provider Last Rate Last Admin  . acetaminophen (TYLENOL) tablet 650 mg  650 mg Oral Q6H PRN  Ajibola, Ene A, NP   650 mg at 09/27/20 1735  . alum & mag hydroxide-simeth (MAALOX/MYLANTA) 200-200-20 MG/5ML suspension 30 mL  30 mL Oral Q4H PRN Ajibola, Ene A, NP   30 mL at 09/24/20 1257  . gabapentin (NEURONTIN) capsule 200 mg  200 mg Oral TID Arthor Captain, MD      . hydrOXYzine (ATARAX/VISTARIL) tablet 25 mg  25 mg Oral Q6H PRN Ethelene Hal, NP   25 mg at 09/28/20 2114  . magnesium hydroxide (MILK OF MAGNESIA) suspension 30 mL  30 mL Oral Daily PRN Ajibola, Ene A, NP      . melatonin tablet 3 mg  3 mg Oral QHS Arthor Captain, MD      . multivitamin with minerals tablet 1 tablet  1 tablet Oral Daily Sharma Covert, MD   1 tablet at 09/29/20 0932  . pantoprazole (PROTONIX) EC tablet 40 mg  40 mg Oral Daily Viann Fish E, MD   40 mg at 09/29/20 0932  . QUEtiapine (SEROQUEL) tablet 50 mg  50 mg Oral QHS Ethelene Hal, NP   50 mg at 09/28/20 2114  . sertraline (ZOLOFT) tablet 50 mg  50 mg Oral QHS Harlow Asa, MD   50 mg at 09/28/20 2114  . traZODone (DESYREL) tablet 50 mg  50 mg Oral QHS,MR X 1 Ethelene Hal, NP   50 mg at 09/28/20 2114    Lab Results:  No results found for this or any previous visit (from the past 48 hour(s)).  Blood Alcohol level:  Lab Results  Component Value Date   ETH <10 24/40/1027    Metabolic Disorder Labs: Lab Results  Component Value Date   HGBA1C 5.6 09/24/2020   MPG 114 09/24/2020   No results found for: PROLACTIN Lab Results  Component Value Date   CHOL 135 09/26/2020   TRIG 191 (H) 09/26/2020   HDL 26 (L) 09/26/2020   CHOLHDL 5.2 09/26/2020   VLDL 38 09/26/2020   LDLCALC 71 09/26/2020   LDLCALC 60 09/24/2020    Physical Findings: AIMS: Facial and Oral Movements Muscles of Facial Expression: None, normal Lips and Perioral Area: None, normal Jaw: None, normal Tongue: None, normal,Extremity Movements Upper (arms, wrists, hands, fingers): None, normal Lower (legs, knees, ankles, toes): None, normal,  Trunk Movements Neck, shoulders, hips: None, normal, Overall Severity Severity of abnormal movements (highest score from questions above): None, normal Incapacitation due to abnormal movements: None, normal Patient's awareness of abnormal movements (rate only patient's report): No Awareness, Dental Status Current problems with teeth and/or dentures?: No Does patient usually wear dentures?: No  CIWA:    COWS:     Musculoskeletal: Strength & Muscle Tone: within normal limits Gait & Station: normal Patient leans: N/A  Psychiatric Specialty Exam:  Presentation  General Appearance: Appropriate for Environment; Casual  Eye Contact:Good  Speech:Clear and Coherent; Normal Rate  Speech Volume:Normal  Handedness:Right  Mood and Affect  Mood:Anxious  Affect:Appropriate; Congruent  Thought Process  Thought Processes:Coherent; Goal Directed; Linear  Descriptions of Associations:Intact  Orientation:Full (Time, Place and Person)  Thought Content:Logical  History of Schizophrenia/Schizoaffective disorder:No  Duration of Psychotic Symptoms:No data recorded Hallucinations:No data recorded  Ideas of Reference:None  Suicidal Thoughts:No data recorded  Homicidal Thoughts:No data recorded  Sensorium  Memory:Immediate Good; Recent Good; Remote Fair  Judgment:Fair  Insight:Fair  Executive Functions  Concentration:Good  Attention Span:Good  Ideal of Knowledge:Good  Language:Good  Psychomotor Activity  Psychomotor Activity:No data recorded  Assets  Assets:Communication Skills; Desire for Improvement; Financial Resources/Insurance; Housing; Physical Health; Resilience; Social Support  Sleep  Sleep:No data recorded  Physical Exam: Physical Exam Vitals and nursing note reviewed.  Constitutional:      Appearance: He is obese.  HENT:     Head: Normocephalic.  Musculoskeletal:        General: Normal range of motion.     Cervical back: Normal range  of motion.  Neurological:     Mental Status: He is alert and oriented to person, place, and time.  Psychiatric:        Mood and Affect: Mood is anxious.        Speech: Speech normal.        Behavior: Behavior is cooperative.        Thought Content: Thought content is not paranoid or delusional. Thought content does not include homicidal ideation. Thought content does not include homicidal plan.        Cognition and Memory: Cognition normal.    Review of Systems  Constitutional: Negative.  Negative for fever.  HENT: Negative.  Negative for congestion and sore throat.   Respiratory: Negative.  Negative for cough and shortness of breath.   Cardiovascular: Negative.  Negative for chest pain.  Gastrointestinal: Negative.   Genitourinary: Negative.   Musculoskeletal: Negative.   Neurological: Negative.    Blood pressure (!) 147/87, pulse (!) 102, temperature 98.2 F (36.8 C), temperature source Oral, resp. rate 18, height 6' 1" (1.854 m), weight (!) 156.5 kg, SpO2 99 %. Body mass index is 45.52 kg/m.   Treatment Plan Summary: Daily contact with patient to assess and evaluate symptoms and progress in treatment and Medication management    Depression:  -Continue Zoloft to 50 mg PO qhs.  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  Anticipate discharge within the next 24 to 48 hours.  I certify that inpatient services furnished can reasonably be expected to improve the patient's condition   Arthor Captain, MD 09/29/2020, 1:15 PM

## 2020-09-29 NOTE — Discharge Summary (Signed)
Physician Discharge Summary Note  Patient:  Leonard BuffRicardo Clark is an 25 y.o., male MRN:  [id number] DOB:  03/26/1996 Patient phone:  [phone] (home)  Patient address:   [address] [place] [address] [place] [id number],  Total Time spent with patient: Greater than 30 minutes  Date of Admission:  09/24/2020  Date of Discharge: 09-30-20  Reason for Admission: Worsening symptoms of depression, anxiety & suicidal ideations.  Principal Problem: MDD (major depressive disorder), recurrent episode, severe (HCC)  Discharge Diagnoses: Principal Problem:   MDD (major depressive disorder), recurrent episode, severe (HCC) Active Problems:   Depression   Anxiety disorder, unspecified  Past Psychiatric History:   Past Medical History:  Past Medical History:  Diagnosis Date  . Medical history non-contributory     Past Surgical History:  Procedure Laterality Date  . ADENOIDECTOMY    . ANTERIOR CRUCIATE LIGAMENT REPAIR    . CHOLECYSTECTOMY    . TONSILLECTOMY    . TYMPANOSTOMY TUBE PLACEMENT     Family History: History reviewed. No pertinent family history.  Family Psychiatric  History: See H&P  Social History:  Social History   Substance and Sexual Activity  Alcohol Use Never     Social History   Substance and Sexual Activity  Drug Use Yes  . Frequency: 1.5 times per week  . Types: Marijuana   Comment: 1.5 grams daily    Social History   Socioeconomic History  . Marital status: Single    Spouse name: Not on file  . Number of children: 0  . Years of education: Not on file  . Highest education level: GED or equivalent  Occupational History  . Not on file  Tobacco Use  . Smoking status: Former Games developermoker  . Smokeless tobacco: Never Used  Vaping Use  . Vaping Use: Former  Substance and Sexual Activity  . Alcohol use: Never  . Drug use: Yes    Frequency: 1.5 times per week    Types: Marijuana    Comment: 1.5 grams daily  . Sexual activity: Not Currently  Other Topics  Concern  . Not on file  Social History Narrative  . Not on file   Social Determinants of Health   Financial Resource Strain: Not on file  Food Insecurity: Not on file  Transportation Needs: Not on file  Physical Activity: Not on file  Stress: Not on file  Social Connections: Not on file   Hospital Course: (Per Md's admission evaluation notes): Patient is a 25y/o male admitted voluntarily for management of worsening depression, SI, and anxiety. According to TTS assessment, he [facility]ac[facility] for chest discomfort, anxiety, depression and SI with thoughts of overdosing on Lexapro. He states that he has suffered for the last 1 1/2 years with chronic depressed mood. He states that he has chronic passive SI, and 1 year ago had written letters to family with plans to "go to a right to die" state to "commit legal suicide" when he was stopped by his cousin. He states he never sought help for his depression and never has been in a psychiatric hospital. He reports that recently he has been having palpitations, chest tightness, "stinging in my heart" and heart racing and has undergone an extensive cardiac w/u. He was told by medical providers that his symptoms were c/w anxiety and are not cardiac in nature. His PCP has reportedly referred him to cardiology for an assessment. He reportedly was started on Inderal and Vistaril by the ED 2 weeks ago for  anxiety management, and his PCP started him on Lexapro last week. He states that after starting Lexapro he feels he had increased suicidal ideation and onset of intrusive, unwanted thoughts about death. He states the word death or dead would pop in his mind and he had to find distractions like cleaning, watching positive videos, or talking to his mother to get the thoughts to go away.  Prior to this discharge, Leonard Clark was seen & evaluated for mental health stability. The current laboratory findings were reviewed (stable), nurses notes & vital signs  were reviewed as well. There are no current mental health or medical issues that should prevent this discharge at this time. Patient is being discharged to continue mental health issues as noted below.  This isthefirst psychiatricadmission/discharge summary for this65year old Hispanic male from this [facility]. He was admitted to the [facility] with complaints of worsening symptoms of depression, anxiety & suicidal ideations with plans to overdose on Lexapro. His UDS was positive for THC.  He was recommended for mood stabilization treatments.  After evaluation of hispresenting symptoms, it was jointly agreed by the treatment team torecommend Leonard Clark mood stabilization treatments. The medication regimentargeting those presenting symptomswere discussed &initiatedwith his consent. He was treated, stabilized & discharged on themedicationsas listed below on his discharge medication lists. He was also enrolled & participated in the group counseling sessions being offered & held on this unit.He learned coping skills.He presented no other significant pre-existing medical issues that required treatment & or monitoring.He tolerated histreatment regimen without any adverse effects or reactions reported.   Leonard Clark's symptoms responded well to histreatment regimen. This is evidenced by hisreports of improved mood, resolution of symptoms,presentation of good affect/eye contact.Hepresentscurrently mentally & medically stable for dischargetoday to continue mental health care on an outpatient basisas recommended below.   Today upon his discharge evaluationwith the attending psychiatrist,Leonard Clark says, "I'm doing much better".He denies any specific concerns.He is sleeping well. Hisappetite is good. He denies any other physical complaints.He denies SI/HI/AH/VH, delusional thoughts or paranoia.He is tolerating hismedications well &in agreement to continue hiscurrent regimen as recommended.He will  follow up for routine psychiatric care & medication management as noted below. He is provided with all the necessary information needed to make these appointments without problems. He was able to engage in safety planning including plan to return to [facility] or contact emergency services if he feels unable to maintain hisown safety or the safety of others. Pt had no further questions, comments or concerns. He left Leonard Clark with all personal belongings in no apparent distress.Transportation per his mother.  Physical Findings: AIMS: Facial and Oral Movements Muscles of Facial Expression: None, normal Lips and Perioral Area: None, normal Jaw: None, normal Tongue: None, normal,Extremity Movements Upper (arms, wrists, hands, fingers): None, normal Lower (legs, knees, ankles, toes): None, normal, Trunk Movements Neck, shoulders, hips: None, normal, Overall Severity Severity of abnormal movements (highest score from questions above): None, normal Incapacitation due to abnormal movements: None, normal Patient's awareness of abnormal movements (rate only patient's report): No Awareness, Dental Status Current problems with teeth and/or dentures?: No Does patient usually wear dentures?: No  CIWA:    COWS:     Musculoskeletal: Strength & Muscle Tone: within normal limits Gait & Station: normal Patient leans: N/A  Psychiatric Specialty Exam:  Presentation  General Appearance: Appropriate for Environment; Casual  Eye Contact:Good  Speech:Clear and Coherent; Normal Rate  Speech Volume:Normal  Handedness:Right   Mood and Affect  Mood:Anxious  Affect:Appropriate; Congruent   Thought Process  Thought Processes:Coherent;  Goal Directed; Linear  Descriptions of Associations:Intact  Orientation:Full (Time, Place and Person)  Thought Content:Logical  History of Schizophrenia/Schizoaffective disorder:No  Duration of Psychotic Symptoms:No data recorded Hallucinations:No data recorded Ideas of  Reference:None  Suicidal Thoughts:No data recorded Homicidal Thoughts:No data recorded  Sensorium  Memory:Immediate Good; Recent Good; Remote Fair  Judgment:Fair  Insight:Fair   Executive Functions  Concentration:Good  Attention Span:Good  Recall:Good  Fund of Knowledge:Good  Language:Good   Psychomotor Activity  Psychomotor Activity:No data recorded  Assets  Assets:Communication Skills; Desire for Improvement; Financial Resources/Insurance; Housing; Physical Health; Resilience; Social Support   Sleep  Sleep:No data recorded   Physical Exam: Physical Exam Vitals and nursing note reviewed.  HENT:     Head: Normocephalic.     Nose: Nose normal.     Mouth/Throat:     Pharynx: Oropharynx is clear.  Eyes:     Pupils: Pupils are equal, round, and reactive to light.  Cardiovascular:     Rate and Rhythm: Normal rate.     Pulses: Normal pulses.  Pulmonary:     Effort: Pulmonary effort is normal.  Genitourinary:    Comments: Deferred Musculoskeletal:        General: Normal range of motion.     Cervical back: Normal range of motion.  Skin:    General: Skin is warm and dry.  Neurological:     General: No focal deficit present.     Mental Status: He is alert and oriented to person, place, and time. Mental status is at baseline.    Review of Systems  Constitutional: Negative.   HENT: Negative.   Eyes: Negative.   Respiratory: Negative.   Cardiovascular: Negative.   Gastrointestinal: Negative.   Genitourinary: Negative.   Musculoskeletal: Negative.   Skin: Negative.   Neurological: Negative.   Psychiatric/Behavioral: Positive for depression (Stable).   Blood pressure (!) 141/98, pulse 92, temperature 97.9 F (36.6 C), temperature source Oral, resp. rate 18, height 6\' 1"  (1.854 m), weight (!) 156.5 kg, SpO2 99 %. Body mass index is 45.52 kg/m.  Has this patient used any form of tobacco in the last 30 days? (Cigarettes, Smokeless Tobacco, Cigars, and/or  Pipes): N/A  Blood Alcohol level:  Lab Results  Component Value Date   ETH <10 09/23/2020    Metabolic Disorder Labs:  Lab Results  Component Value Date   HGBA1C 5.6 09/24/2020   MPG 114 09/24/2020   No results found for: PROLACTIN Lab Results  Component Value Date   CHOL 135 09/26/2020   TRIG 191 (H) 09/26/2020   HDL 26 (L) 09/26/2020   CHOLHDL 5.2 09/26/2020   VLDL 38 09/26/2020   LDLCALC 71 09/26/2020   LDLCALC 60 09/24/2020   See Psychiatric Specialty Exam and Suicide Risk Assessment completed by Attending Physician prior to discharge.  Discharge destination:  Home  Is patient on multiple antipsychotic therapies at discharge:  No   Has Patient had three or more failed trials of antipsychotic monotherapy by history:  No  Recommended Plan for Multiple Antipsychotic Therapies: NA  Allergies as of 09/30/2020      Reactions   Red Dye    Other Rash   Rx acne medication      Medication List    STOP taking these medications   acetaminophen 325 MG tablet Commonly known as: TYLENOL   escitalopram 10 MG tablet Commonly known as: LEXAPRO   ondansetron 4 MG tablet Commonly known as: ZOFRAN   ondansetron 8 MG disintegrating tablet Commonly known as: Zofran  ODT   propranolol 10 MG tablet Commonly known as: INDERAL     TAKE these medications     Indication  gabapentin 100 MG capsule Commonly known as: NEURONTIN Take 2 capsules (200 mg total) by mouth 3 (three) times daily. For agitation/neuropathic pain  Indication: Neuropathic Pain   hydrOXYzine 25 MG tablet Commonly known as: ATARAX/VISTARIL Take 1 tablet (25 mg total) by mouth every 6 (six) hours as needed for anxiety. What changed:   when to take this  reasons to take this  Another medication with the same name was removed. Continue taking this medication, and follow the directions you see here.  Indication: Feeling Anxious   melatonin 3 MG Tabs tablet Take 1 tablet (3 mg total) by mouth at  bedtime. For sleep  Indication: Trouble Sleeping   pantoprazole 40 MG tablet Commonly known as: PROTONIX Take 1 tablet (40 mg total) by mouth daily. For acid reflux  Indication: Gastroesophageal Reflux Disease   QUEtiapine 50 MG tablet Commonly known as: SEROQUEL Take 1 tablet (50 mg total) by mouth at bedtime. For mood control  Indication: Generalized Anxiety Disorder, Major Depressive Disorder, Mood control   sertraline 50 MG tablet Commonly known as: ZOLOFT Take 1 tablet (50 mg total) by mouth at bedtime. For depression  Indication: Generalized Anxiety Disorder, Major Depressive Disorder   traZODone 50 MG tablet Commonly known as: DESYREL Take 1 tablet (50 mg total) by mouth at bedtime as needed for sleep.  Indication: Trouble Sleeping       Follow-up Information    [facility]. Go on 10/03/2020.   Why: You have an appointment on 10/03/20 at 9:00 am for therapy and medication management services.  This appointment will be held  in person. Contact information: [address] [place] [address] [phone]               Follow-up recommendations:  Activity:  As tolerated Diet: As recommended by your primary care doctor. Keep all scheduled follow-up appointments as recommended.  Comments: Prescriptions given at discharge.  Patient agreeable to plan.  Given opportunity to ask questions.  Appears to feel comfortable with discharge denies any current suicidal or homicidal thought. Patient is also instructed prior to discharge to: Take all medications as prescribed by his/her mental healthcare provider. Report any adverse effects and or reactions from the medicines to his/her outpatient provider promptly. Patient has been instructed & cautioned: To not engage in alcohol and or illegal drug use while on prescription medicines. In the event of worsening symptoms, patient is instructed to call the crisis hotline, 911 and or go to the nearest ED for  appropriate evaluation and treatment of symptoms. To follow-up with his/her primary care provider for your other medical issues, concerns and or health care needs.  Signed: Armandina Stammer, NP, PMHNP, FNP-BC 09/30/2020, 9:44 AM

## 2020-09-29 NOTE — Plan of Care (Signed)
  Problem: Activity: Goal: Imbalance in normal sleep/wake cycle will improve Outcome: Progressing   Problem: Coping: Goal: Coping ability will improve Outcome: Progressing Goal: Will verbalize feelings Outcome: Progressing   

## 2020-09-29 NOTE — Progress Notes (Signed)
Progress note    09/29/20 0932  Psych Admission Type (Psych Patients Only)  Admission Status Voluntary  Psychosocial Assessment  Patient Complaints Anxiety  Eye Contact Fair  Facial Expression Anxious  Affect Anxious  Speech Logical/coherent  Motor Activity Slow  Appearance/Hygiene Unremarkable  Behavior Characteristics Cooperative;Appropriate to situation  Mood Anxious;Pleasant  Thought Process  Coherency WDL  Content WDL  Delusions None reported or observed  Perception WDL  Hallucination None reported or observed  Judgment WDL  Confusion None  Danger to Self  Current suicidal ideation? Denies  Danger to Others  Danger to Others None reported or observed

## 2020-09-29 NOTE — Progress Notes (Signed)
D: Patient presents with anxious affect at time of assessment. Patient reports anxiety regarding his discharge and his plans for the future. Patient denies SI/HI at this time. Patient also denies AH/VH at this time. Patient contracts for safety.  A: Provided positive reinforcement and encouragement.  R: Patient cooperative and receptive to efforts. Patient remains safe on the unit.   09/28/20 2114  Psych Admission Type (Psych Patients Only)  Admission Status Voluntary  Psychosocial Assessment  Patient Complaints Anxiety  Eye Contact Fair  Facial Expression Anxious;Animated  Affect Appropriate to circumstance;Anxious  Speech Logical/coherent  Interaction Assertive  Motor Activity Other (Comment) (WDL)  Appearance/Hygiene Improved  Behavior Characteristics Cooperative;Anxious  Mood Anxious;Pleasant  Thought Process  Coherency WDL  Content WDL  Delusions None reported or observed  Perception WDL  Hallucination None reported or observed  Judgment Poor  Confusion None  Danger to Self  Current suicidal ideation? Denies  Self-Injurious Behavior No self-injurious ideation or behavior indicators observed or expressed   Agreement Not to Harm Self Yes  Description of Agreement verbal contract  Danger to Others  Danger to Others None reported or observed

## 2020-09-30 DIAGNOSIS — F332 Major depressive disorder, recurrent severe without psychotic features: Principal | ICD-10-CM

## 2020-09-30 MED ORDER — MELATONIN 3 MG PO TABS: 3.0000 mg | ORAL_TABLET | Freq: Every day | ORAL | 0 refills | Status: AC

## 2020-09-30 MED ORDER — GABAPENTIN 100 MG PO CAPS: 200.0000 mg | ORAL_CAPSULE | Freq: Three times a day (TID) | ORAL | 0 refills | Status: AC

## 2020-09-30 MED ORDER — CLONIDINE HCL 0.1 MG PO TABS
0.1000 mg | ORAL_TABLET | Freq: Once | ORAL | Status: AC
  Administered 2020-09-30: 0.1 mg via ORAL
  Filled 2020-09-30 (×2): qty 1

## 2020-09-30 NOTE — BHH Suicide Risk Assessment (Signed)
CSW spoke with pt's mother,  Clemetine Marker [phone], who stated that all firearms in the home have been secured in a lockbox that pt does not have access to.   Fredirick Lathe, LCSWA Clinicial Social Worker [employer]
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
## 2020-09-30 NOTE — BHH Counselor (Signed)
CSW wrote and added information for PHP, group therapy and SAIOP onto pt's IVC as it had already been printed.  Fredirick Lathe, LCSWA Clinicial Social Worker [employer]

## 2020-09-30 NOTE — Progress Notes (Signed)
Recreation Therapy Notes  Date:  4.22.22 Time: 0930 Location: [address] Dayroom  Group Topic: Stress Management  Goal Area(s) Addresses:  Patient will identify positive stress management techniques. Patient will identify benefits of using stress management post d/c.  Behavioral Response: Engaged  Intervention: Stress Management  Activity :  Progressive Muscle Relaxation.  LRT read a script that guided patients in tensing each muscle group individually and then relaxing it to release any tension they may have in the body.  Education:  Stress Management, Discharge Planning.   Education Outcome: Acknowledges Education  Clinical Observations/Feedback: Pt attended and participated in group activity.    Caroll Rancher, LRT/CTRS         Caroll Rancher A 09/30/2020 10:55 AM

## 2020-09-30 NOTE — Tx Team (Signed)
Interdisciplinary Treatment and Diagnostic Plan Update  09/30/2020 Time of Session: 9:10am Leonard Clark MRN: [id number]  Principal Diagnosis: MDD (major depressive disorder), recurrent episode, severe (HCC)  Secondary Diagnoses: Principal Problem:   MDD (major depressive disorder), recurrent episode, severe (HCC) Active Problems:   Depression   Anxiety disorder, unspecified   Current Medications:  Current Facility-Administered Medications  Medication Dose Route Frequency Provider Last Rate Last Admin  . acetaminophen (TYLENOL) tablet 650 mg  650 mg Oral Q6H PRN Ajibola, Ene A, NP   650 mg at 09/27/20 1735  . alum & mag hydroxide-simeth (MAALOX/MYLANTA) 200-200-20 MG/5ML suspension 30 mL  30 mL Oral Q4H PRN Ajibola, Ene A, NP   30 mL at 09/24/20 1257  . gabapentin (NEURONTIN) capsule 200 mg  200 mg Oral TID Claudie Revering, MD   200 mg at 09/30/20 0736  . hydrOXYzine (ATARAX/VISTARIL) tablet 25 mg  25 mg Oral Q6H PRN Laveda Abbe, NP   25 mg at 09/29/20 1815  . magnesium hydroxide (MILK OF MAGNESIA) suspension 30 mL  30 mL Oral Daily PRN Ajibola, Ene A, NP      . melatonin tablet 3 mg  3 mg Oral QHS Claudie Revering, MD   3 mg at 09/29/20 2112  . multivitamin with minerals tablet 1 tablet  1 tablet Oral Daily Antonieta Pert, MD   1 tablet at 09/30/20 0736  . pantoprazole (PROTONIX) EC tablet 40 mg  40 mg Oral Daily Comer Locket, MD   40 mg at 09/30/20 0736  . QUEtiapine (SEROQUEL) tablet 50 mg  50 mg Oral QHS Laveda Abbe, NP   50 mg at 09/29/20 2112  . sertraline (ZOLOFT) tablet 50 mg  50 mg Oral QHS Comer Locket, MD   50 mg at 09/29/20 2113  . traZODone (DESYREL) tablet 50 mg  50 mg Oral QHS,MR X 1 Laveda Abbe, NP   50 mg at 09/29/20 2113   PTA Medications: Medications Prior to Admission  Medication Sig Dispense Refill Last Dose  . acetaminophen (TYLENOL) 325 MG tablet Take 650 mg by mouth every 6 (six) hours as needed for mild pain or  headache.     . hydrOXYzine (ATARAX/VISTARIL) 25 MG tablet Take 1 tablet (25 mg total) by mouth every 6 (six) hours. 12 tablet 0 09/24/2020 at Unknown time  . escitalopram (LEXAPRO) 10 MG tablet Take 10 mg by mouth daily.   Unknown at Unknown time  . hydrOXYzine (ATARAX/VISTARIL) 25 MG tablet Take by mouth.     . ondansetron (ZOFRAN ODT) 8 MG disintegrating tablet Take 1 tablet (8 mg total) by mouth every 8 (eight) hours as needed for nausea or vomiting. (Patient not taking: Reported on 09/24/2020) 10 tablet 1 Not Taking at Unknown time  . ondansetron (ZOFRAN) 4 MG tablet Take 1 tablet (4 mg total) by mouth every 6 (six) hours. (Patient not taking: Reported on 09/24/2020) 12 tablet 0 Not Taking at Unknown time  . [EXPIRED] propranolol (INDERAL) 10 MG tablet Take by mouth. (Patient not taking: Reported on 09/24/2020)   Not Taking at Unknown time    Patient Stressors: Medication change or noncompliance Substance abuse  Patient Strengths: Ability for insight Average or above average intelligence Supportive family/friends  Treatment Modalities: Medication Management, Group therapy, Case management,  1 to 1 session with clinician, Psychoeducation, Recreational therapy.   Physician Treatment Plan for Primary Diagnosis: MDD (major depressive disorder), recurrent episode, severe (HCC) Long Term Goal(s): Improvement in symptoms so as  ready for discharge Improvement in symptoms so as ready for discharge   Short Term Goals: Ability to identify changes in lifestyle to reduce recurrence of condition will improve Ability to verbalize feelings will improve Ability to disclose and discuss suicidal ideas Ability to identify and develop effective coping behaviors will improve Ability to identify triggers associated with substance abuse/mental health issues will improve Ability to identify changes in lifestyle to reduce recurrence of condition will improve Ability to verbalize feelings will improve Ability  to disclose and discuss suicidal ideas Ability to identify and develop effective coping behaviors will improve Compliance with prescribed medications will improve Ability to identify triggers associated with substance abuse/mental health issues will improve  Medication Management: Evaluate patient's response, side effects, and tolerance of medication regimen.  Therapeutic Interventions: 1 to 1 sessions, Unit Group sessions and Medication administration.  Evaluation of Outcomes: Adequate for Discharge  Physician Treatment Plan for Secondary Diagnosis: Principal Problem:   MDD (major depressive disorder), recurrent episode, severe (HCC) Active Problems:   Depression   Anxiety disorder, unspecified  Long Term Goal(s): Improvement in symptoms so as ready for discharge Improvement in symptoms so as ready for discharge   Short Term Goals: Ability to identify changes in lifestyle to reduce recurrence of condition will improve Ability to verbalize feelings will improve Ability to disclose and discuss suicidal ideas Ability to identify and develop effective coping behaviors will improve Ability to identify triggers associated with substance abuse/mental health issues will improve Ability to identify changes in lifestyle to reduce recurrence of condition will improve Ability to verbalize feelings will improve Ability to disclose and discuss suicidal ideas Ability to identify and develop effective coping behaviors will improve Compliance with prescribed medications will improve Ability to identify triggers associated with substance abuse/mental health issues will improve     Medication Management: Evaluate patient's response, side effects, and tolerance of medication regimen.  Therapeutic Interventions: 1 to 1 sessions, Unit Group sessions and Medication administration.  Evaluation of Outcomes: Adequate for Discharge   RN Treatment Plan for Primary Diagnosis: MDD (major depressive  disorder), recurrent episode, severe (HCC) Long Term Goal(s): Knowledge of disease and therapeutic regimen to maintain health will improve  Short Term Goals: Ability to verbalize frustration and anger appropriately will improve, Ability to verbalize feelings will improve, Ability to disclose and discuss suicidal ideas and Ability to identify and develop effective coping behaviors will improve  Medication Management: RN will administer medications as ordered by provider, will assess and evaluate patient's response and provide education to patient for prescribed medication. RN will report any adverse and/or side effects to prescribing provider.  Therapeutic Interventions: 1 on 1 counseling sessions, Psychoeducation, Medication administration, Evaluate responses to treatment, Monitor vital signs and CBGs as ordered, Perform/monitor CIWA, COWS, AIMS and Fall Risk screenings as ordered, Perform wound care treatments as ordered.  Evaluation of Outcomes: Adequate for Discharge   LCSW Treatment Plan for Primary Diagnosis: MDD (major depressive disorder), recurrent episode, severe (HCC) Long Term Goal(s): Safe transition to appropriate next level of care at discharge, Engage patient in therapeutic group addressing interpersonal concerns.  Short Term Goals: Engage patient in aftercare planning with referrals and resources, Increase social support, Increase ability to appropriately verbalize feelings, Increase emotional regulation, Facilitate acceptance of mental health diagnosis and concerns, Identify triggers associated with mental health/substance abuse issues and Increase skills for wellness and recovery  Therapeutic Interventions: Assess for all discharge needs, 1 to 1 time with Social worker, Explore available resources and support systems, Assess  for adequacy in community support network, Educate family and significant other(s) on suicide prevention, Complete Psychosocial Assessment, Interpersonal group  therapy.  Evaluation of Outcomes: Adequate for Discharge   Progress in Treatment: Attending groups: Yes. Participating in groups: Yes. Taking medication as prescribed: Yes. Toleration medication: Yes. Family/Significant other contact made: Yes, individual(s) contacted:  Mother, Clemetine Marker Patient understands diagnosis: Yes. Discussing patient identified problems/goals with staff: Yes. Medical problems stabilized or resolved: Yes. Denies suicidal/homicidal ideation: Yes. Issues/concerns per patient self-inventory: No.   New problem(s) identified: No, Describe:  none reported  New Short Term/Long Term Goal(s):   medication stabilization, elimination of SI thoughts, development of comprehensive mental wellness plan.    Patient Goals:  Patient state that he would like to increase social interaction with peers and be more open about his feelings.   Discharge Plan or Barriers: Patient recently admitted. CSW will continue to follow and assess for appropriate referrals and possible discharge planning.    Reason for Continuation of Hospitalization: Anxiety Depression Suicidal ideation  Estimated Length of Stay: 3 to 5 days Attendees: Patient: Leonard Clark 09/30/2020   Physician: Bartholomew Crews, MD 09/30/2020   Nursing:  09/30/2020   RN Care Manager: 09/30/2020   Social Worker: Anson [place], LCSW 09/30/2020   Recreational Therapist:  09/30/2020   Other:  09/30/2020   Other:  09/30/2020   Other: 09/30/2020     Scribe for Treatment Team: Felizardo Hoffmann, Theresia Majors 09/30/2020 11:28 AM

## 2020-09-30 NOTE — Plan of Care (Signed)
Discharge note  Patient verbalizes readiness for discharge. Follow up plan explained, AVS, Transition record and SRA given. Prescriptions and teaching provided. Belongings returned and signed for. Suicide safety plan completed and signed. Patient verbalizes understanding. Patient denies SI/HI and assures this Clinical research associate they will seek assistance should that change. Patient discharged to lobby where mother was waiting.  Problem: Education: Goal: Ability to make informed decisions regarding treatment will improve Outcome: Adequate for Discharge   Problem: Coping: Goal: Coping ability will improve Outcome: Adequate for Discharge   Problem: Health Behavior/Discharge Planning: Goal: Identification of resources available to assist in meeting health care needs will improve Outcome: Adequate for Discharge   Problem: Medication: Goal: Compliance with prescribed medication regimen will improve Outcome: Adequate for Discharge   Problem: Self-Concept: Goal: Ability to disclose and discuss suicidal ideas will improve Outcome: Adequate for Discharge Goal: Will verbalize positive feelings about self Outcome: Adequate for Discharge   Problem: Education: Goal: Utilization of techniques to improve thought processes will improve Outcome: Adequate for Discharge Goal: Knowledge of the prescribed therapeutic regimen will improve Outcome: Adequate for Discharge   Problem: Activity: Goal: Interest or engagement in leisure activities will improve Outcome: Adequate for Discharge Goal: Imbalance in normal sleep/wake cycle will improve Outcome: Adequate for Discharge   Problem: Coping: Goal: Coping ability will improve 09/30/2020 1851 by Raylene Miyamoto, RN Outcome: Adequate for Discharge 09/30/2020 [phone] by Raylene Miyamoto, RN Outcome: Progressing Goal: Will verbalize feelings 09/30/2020 1851 by Raylene Miyamoto, RN Outcome: Adequate for Discharge 09/30/2020 [phone] by Raylene Miyamoto,  RN Outcome: Progressing   Problem: Health Behavior/Discharge Planning: Goal: Ability to make decisions will improve Outcome: Adequate for Discharge Goal: Compliance with therapeutic regimen will improve Outcome: Adequate for Discharge   Problem: Role Relationship: Goal: Will demonstrate positive changes in social behaviors and relationships Outcome: Adequate for Discharge   Problem: Safety: Goal: Ability to disclose and discuss suicidal ideas will improve Outcome: Adequate for Discharge Goal: Ability to identify and utilize support systems that promote safety will improve Outcome: Adequate for Discharge   Problem: Self-Concept: Goal: Will verbalize positive feelings about self Outcome: Adequate for Discharge Goal: Level of anxiety will decrease Outcome: Adequate for Discharge   Problem: Education: Goal: Knowledge of [facility] Education information/materials will improve Outcome: Adequate for Discharge Goal: Emotional status will improve Outcome: Adequate for Discharge Goal: Mental status will improve Outcome: Adequate for Discharge Goal: Verbalization of understanding the information provided will improve Outcome: Adequate for Discharge   Problem: Activity: Goal: Interest or engagement in activities will improve Outcome: Adequate for Discharge Goal: Sleeping patterns will improve Outcome: Adequate for Discharge   Problem: Coping: Goal: Ability to verbalize frustrations and anger appropriately will improve Outcome: Adequate for Discharge Goal: Ability to demonstrate self-control will improve Outcome: Adequate for Discharge   Problem: Health Behavior/Discharge Planning: Goal: Identification of resources available to assist in meeting health care needs will improve Outcome: Adequate for Discharge Goal: Compliance with treatment plan for underlying cause of condition will improve Outcome: Adequate for Discharge   Problem: Physical Regulation: Goal: Ability  to maintain clinical measurements within normal limits will improve Outcome: Adequate for Discharge   Problem: Safety: Goal: Periods of time without injury will increase Outcome: Adequate for Discharge   Problem: Education: Goal: Knowledge of disease or condition will improve Outcome: Adequate for Discharge Goal: Understanding of discharge needs will improve Outcome: Adequate for Discharge   Problem: Health Behavior/Discharge Planning: Goal: Ability to identify changes in lifestyle to reduce  recurrence of condition will improve Outcome: Adequate for Discharge Goal: Identification of resources available to assist in meeting health care needs will improve Outcome: Adequate for Discharge   Problem: Physical Regulation: Goal: Complications related to the disease process, condition or treatment will be avoided or minimized Outcome: Adequate for Discharge   Problem: Safety: Goal: Ability to remain free from injury will improve Outcome: Adequate for Discharge

## 2020-09-30 NOTE — Progress Notes (Signed)
  [facility] Adult Case Management Discharge Plan :  Will you be returning to the same living situation after discharge:  Yes,  mother's home At discharge, do you have transportation home?: Yes,  mother Do you have the ability to pay for your medications: Yes,  parental support  Release of information consent forms completed and in the chart;  Patient's signature needed at discharge.  Patient to Follow up at:  Follow-up Information    [facility]. Go on 10/03/2020.   Why: You have an appointment on 10/03/20 at 9:00 am for therapy and medication management services.  This appointment will be held  in person. Contact information: [address] [place] [address] [phone]               Next level of care provider has access to [facility] Link:no  Safety Planning and Suicide Prevention discussed: Yes,  w/ mother     Has patient been referred to the Quitline?: Patient refused referral  Patient has been referred for addiction treatment: N/A  Felizardo Hoffmann, Theresia Majors 09/30/2020, 10:17 AM

## 2020-09-30 NOTE — Plan of Care (Signed)
  Problem: Coping: Goal: Coping ability will improve Outcome: Progressing Goal: Will verbalize feelings Outcome: Progressing   

## 2020-09-30 NOTE — [facility] Group Notes (Signed)
Type of Therapy and Topic: Group Therapy: Anger Management   Participation: Attended  Description of Group: In this group, patients will learn helpful strategies and techniques to manage anger, express anger in alternative ways, change hostile attitudes, and prevent aggressive acts, such as verbal abuse and violence.This group will be process-oriented and eductional, with patients participating in exploration of their own experiences as well as giving and receiving support and challenge from other group members.  Therapeutic Goals: 1. Patient will learn to manage anger. 2. Patient will learn to stop violence or the threat of violence. 3. Patient will learn to develop self control over thoughts and actions. 4. Patient will receive support and feedback from others  Therapeutic Modalities: Cognitive Behavioral Therapy Solution Focused Therapy Motivational Interviewing  Pattijo Juste, LCSWA Clinicial Social Worker [facility] 

## 2021-09-24 ENCOUNTER — Encounter (HOSPITAL_BASED_OUTPATIENT_CLINIC_OR_DEPARTMENT_OTHER): Payer: Self-pay | Admitting: Emergency Medicine

## 2021-09-24 ENCOUNTER — Emergency Department (HOSPITAL_BASED_OUTPATIENT_CLINIC_OR_DEPARTMENT_OTHER)
Admission: EM | Admit: 2021-09-24 | Discharge: 2021-09-24 | Disposition: A | Discharge status: Home / Self Care | Payer: Self-pay | Attending: Emergency Medicine | Admitting: Emergency Medicine

## 2021-09-24 ENCOUNTER — Emergency Department (HOSPITAL_BASED_OUTPATIENT_CLINIC_OR_DEPARTMENT_OTHER): Payer: Self-pay

## 2021-09-24 DIAGNOSIS — R000 Tachycardia, unspecified: Secondary | ICD-10-CM | POA: Insufficient documentation

## 2021-09-24 DIAGNOSIS — Z79899 Other long term (current) drug therapy: Secondary | ICD-10-CM | POA: Insufficient documentation

## 2021-09-24 DIAGNOSIS — R079 Chest pain, unspecified: Secondary | ICD-10-CM

## 2021-09-24 DIAGNOSIS — R002 Palpitations: Secondary | ICD-10-CM

## 2021-09-24 HISTORY — DX: Anxiety disorder, unspecified: F41.9

## 2021-09-24 HISTORY — DX: Gastro-esophageal reflux disease without esophagitis: K21.9

## 2021-09-24 HISTORY — DX: Essential (primary) hypertension: I10

## 2021-09-24 LAB — CBC
HCT: 50.3 % (ref 39.0–52.0)
Hemoglobin: 17.9 g/dL — ABNORMAL HIGH (ref 13.0–17.0)
MCH: 29.2 pg (ref 26.0–34.0)
MCHC: 35.6 g/dL (ref 30.0–36.0)
MCV: 81.9 fL (ref 80.0–100.0)
Platelets: 300 10*3/uL (ref 150–400)
RBC: 6.14 MIL/uL — ABNORMAL HIGH (ref 4.22–5.81)
RDW: 12.8 % (ref 11.5–15.5)
WBC: 11.8 10*3/uL — ABNORMAL HIGH (ref 4.0–10.5)
nRBC: 0 % (ref 0.0–0.2)

## 2021-09-24 LAB — COMPREHENSIVE METABOLIC PANEL
ALT: 68 U/L — ABNORMAL HIGH (ref 0–44)
AST: 28 U/L (ref 15–41)
Albumin: 4.3 g/dL (ref 3.5–5.0)
Alkaline Phosphatase: 86 U/L (ref 38–126)
Anion gap: 8 (ref 5–15)
BUN: 12 mg/dL (ref 6–20)
CO2: 27 mmol/L (ref 22–32)
Calcium: 9.8 mg/dL (ref 8.9–10.3)
Chloride: 104 mmol/L (ref 98–111)
Creatinine, Ser: 0.9 mg/dL (ref 0.61–1.24)
GFR, Estimated: >60 mL/min (ref >60)
Glucose, Bld: 91 mg/dL (ref 70–99)
Potassium: 3.6 mmol/L (ref 3.5–5.1)
Sodium: 139 mmol/L (ref 135–145)
Total Bilirubin: 0.8 mg/dL (ref 0.3–1.2)
Total Protein: 8.2 g/dL — ABNORMAL HIGH (ref 6.5–8.1)

## 2021-09-24 LAB — TROPONIN I (HIGH SENSITIVITY): Troponin I (High Sensitivity): 2 ng/L (ref <18)

## 2021-09-24 LAB — D-DIMER, QUANTITATIVE: D-Dimer, Quant: 0.28 ug/mL-FEU (ref 0.00–0.50)

## 2021-09-24 MED ORDER — SODIUM CHLORIDE 0.9 % IV BOLUS
1000.0000 mL | Freq: Once | INTRAVENOUS | Status: AC
  Administered 2021-09-24: 1000 mL via INTRAVENOUS

## 2021-09-24 NOTE — ED Provider Notes (Signed)
?[facility] EMERGENCY DEPARTMENT ?Provider Note ? ? ?CSN: [id number] ?Arrival date & time: 09/24/21  1759 ? ?  ? ?History ? ?Chief Complaint  ?Patient presents with  ? Tachycardia  ? ? ?Leonard Clark is a 26 y.o. male here with palpitations and intermittent chest pain.  Patient states that he has been having intermittent palpitations and chest pain for the last week or so.  He states that it comes and goes and worse in the mornings.  He states that he has some subjective shortness of breath and it is hard for him to take a deep breath.  Patient states that last year, he had several episodes of chest pain and came to the ED several times.  His work-up has been unremarkable during those times.  He states that he did not have insurance and finally got an orange card through [facility].  He states that he has not seen cardiology yet.  Denies any recent travel or hx of CAD.  ? ?The history is provided by the patient.  ? ?  ? ?Home Medications ?Prior to Admission medications   ?Medication Sig Start Date End Date Taking? Authorizing Provider  ?gabapentin (NEURONTIN) 100 MG capsule Take 2 capsules (200 mg total) by mouth 3 (three) times daily. For agitation/neuropathic pain 09/30/20   Armandina Stammer I, NP  ?hydrOXYzine (ATARAX/VISTARIL) 25 MG tablet Take 1 tablet (25 mg total) by mouth every 6 (six) hours as needed for anxiety. 09/29/20   Armandina Stammer I, NP  ?melatonin 3 MG TABS tablet Take 1 tablet (3 mg total) by mouth at bedtime. For sleep 09/30/20   Armandina Stammer I, NP  ?pantoprazole (PROTONIX) 40 MG tablet Take 1 tablet (40 mg total) by mouth daily. For acid reflux 09/30/20   Armandina Stammer I, NP  ?QUEtiapine (SEROQUEL) 50 MG tablet Take 1 tablet (50 mg total) by mouth at bedtime. For mood control 09/29/20   Armandina Stammer I, NP  ?sertraline (ZOLOFT) 50 MG tablet Take 1 tablet (50 mg total) by mouth at bedtime. For depression 09/29/20   Armandina Stammer I, NP  ?traZODone (DESYREL) 50 MG tablet Take 1 tablet (50 mg total) by mouth  at bedtime as needed for sleep. 09/29/20   Sanjuana Kava, NP  ?   ? ?Allergies    ?Red dye and Other   ? ?Review of Systems   ?Review of Systems  ?Cardiovascular:  Positive for chest pain.  ?All other systems reviewed and are negative. ? ?Physical Exam ?Updated Vital Signs ?BP (!) 143/89   Pulse 89   Temp 98.3 ?F (36.8 ?C) (Oral)   Resp 20   Ht 6\' 1"  (1.854 m)   Wt (!) 149.2 kg   SpO2 98%   BMI 43.41 kg/m?  ?Physical Exam ?Vitals and nursing note reviewed.  ?Constitutional:   ?   Comments: Slightly anxious  ?HENT:  ?   Head: Normocephalic.  ?   Nose: Nose normal.  ?   Mouth/Throat:  ?   Mouth: Mucous membranes are moist.  ?Eyes:  ?   Extraocular Movements: Extraocular movements intact.  ?   Pupils: Pupils are equal, round, and reactive to light.  ?Cardiovascular:  ?   Rate and Rhythm: Normal rate and regular rhythm.  ?   Pulses: Normal pulses.  ?   Heart sounds: Normal heart sounds.  ?Pulmonary:  ?   Effort: Pulmonary effort is normal.  ?   Breath sounds: Normal breath sounds.  ?Abdominal:  ?   General:  Abdomen is flat.  ?   Palpations: Abdomen is soft.  ?Musculoskeletal:     ?   General: No swelling or tenderness. Normal range of motion.  ?   Cervical back: Normal range of motion and neck supple.  ?Skin: ?   General: Skin is warm.  ?   Capillary Refill: Capillary refill takes less than 2 seconds.  ?Neurological:  ?   General: No focal deficit present.  ?   Mental Status: He is alert and oriented to person, place, and time.  ?Psychiatric:     ?   Mood and Affect: Mood normal.     ?   Behavior: Behavior normal.  ? ? ?ED Results / Procedures / Treatments   ?Labs ?(all labs ordered are listed, but only abnormal results are displayed) ?Labs Reviewed  ?CBC - Abnormal; Notable for the following components:  ?    Result Value  ? WBC 11.8 (*)   ? RBC 6.14 (*)   ? Hemoglobin 17.9 (*)   ? All other components within normal limits  ?COMPREHENSIVE METABOLIC PANEL  ?D-DIMER, QUANTITATIVE  ?TROPONIN I (HIGH SENSITIVITY)   ? ? ?EKG ?None ? ?Radiology ?DG Chest Port 1 View ? ?Result Date: 09/24/2021 ?CLINICAL DATA:  Palpitations. EXAM: PORTABLE CHEST 1 VIEW COMPARISON:  Chest x-ray dated August 23, 2020. FINDINGS: The heart size and mediastinal contours are within normal limits. Both lungs are clear. The visualized skeletal structures are unremarkable. IMPRESSION: No active disease. Electronically Signed   By: Obie Dredge M.D.   On: 09/24/2021 18:27   ? ?Procedures ?Procedures  ? ? ?Medications Ordered in ED ?Medications  ?sodium chloride 0.9 % bolus 1,000 mL (1,000 mLs Intravenous New Bag/Given 09/24/21 1821)  ? ? ?ED Course/ Medical Decision Making/ A&P ?  ?                        ?Medical Decision Making ?Leonard Clark is a 26 y.o. male here presenting with chest pain and palpitations and shortness of breath.  Intermittent chest pain for the last week or so.  Has some subjective shortness of breath as well.  Vitals are stable and patient is not tachycardic.  Low risk for ACS or PE.  Symptoms going on for weeks troponins x1 sufficient.  We will also get D-dimer as well.  Patient can get outpatient referral for cardiology if work-up is negative ? ?7:22 PM ?Troponin negative x1 and D-dimer negative.  Chest x-ray clear.  CBC and CMP unremarkable.  Will refer to cardiology outpatient for possible Holter monitor and stress test if he has persistent symptoms.  ? ?Problems Addressed: ?Chest pain, unspecified type: acute illness or injury ?Palpitations: acute illness or injury ? ?Amount and/or Complexity of Data Reviewed ?Labs: ordered. Decision-making details documented in ED Course. ?Radiology: ordered and independent interpretation performed. Decision-making details documented in ED Course. ?ECG/medicine tests: ordered and independent interpretation performed. Decision-making details documented in ED Course. ? ? ?Final Clinical Impression(s) / ED Diagnoses ?Final diagnoses:  ?None  ? ? ?Rx / DC Orders ?ED Discharge Orders   ? ? None   ? ?  ? ? ?  ?Charlynne Pander, MD ?09/24/21 1923 ? ?

## 2021-09-24 NOTE — ED Triage Notes (Signed)
Pt sts he has felt like heart is racing, feeling hot and hands sweating x  1wk ?

## 2021-09-24 NOTE — ED Notes (Signed)
Radiology at bedside for portable chest x-ray.

## 2021-09-24 NOTE — ED Notes (Signed)
Patient discharged to home.  All discharge instructions reviewed.  Patient verbalized understanding via teachback method.  VS WDL.  Respirations even and unlabored.  Ambulatory out of ED.   °

## 2021-09-24 NOTE — Discharge Instructions (Signed)
Your work-up in the ED is unremarkable today. ? ?I have referred you to cardiology.  If they do not call you early next week please call their office for appointment ? ?Return to ER if you have worse chest pain, palpitations, trouble breathing ?

## 2021-09-24 NOTE — ED Notes (Signed)
ED Provider at bedside. 

## 2021-10-16 ENCOUNTER — Emergency Department (HOSPITAL_BASED_OUTPATIENT_CLINIC_OR_DEPARTMENT_OTHER): Payer: Self-pay

## 2021-10-16 ENCOUNTER — Encounter (HOSPITAL_BASED_OUTPATIENT_CLINIC_OR_DEPARTMENT_OTHER): Payer: Self-pay | Admitting: Urology

## 2021-10-16 ENCOUNTER — Emergency Department (HOSPITAL_BASED_OUTPATIENT_CLINIC_OR_DEPARTMENT_OTHER)
Admission: EM | Admit: 2021-10-16 | Discharge: 2021-10-16 | Disposition: A | Discharge status: Home / Self Care | Payer: Self-pay | Attending: Emergency Medicine | Admitting: Emergency Medicine

## 2021-10-16 DIAGNOSIS — M5441 Lumbago with sciatica, right side: Secondary | ICD-10-CM | POA: Insufficient documentation

## 2021-10-16 DIAGNOSIS — Z87891 Personal history of nicotine dependence: Secondary | ICD-10-CM | POA: Insufficient documentation

## 2021-10-16 MED ORDER — METHOCARBAMOL 500 MG PO TABS: 500.0000 mg | ORAL_TABLET | Freq: Two times a day (BID) | ORAL | 0 refills | Status: AC

## 2021-10-16 MED ORDER — KETOROLAC TROMETHAMINE 30 MG/ML IJ SOLN
30.0000 mg | Freq: Once | INTRAMUSCULAR | Status: AC
  Administered 2021-10-16: 30 mg via INTRAMUSCULAR
  Filled 2021-10-16: qty 1

## 2021-10-16 NOTE — ED Triage Notes (Signed)
Lower back pain x 1 week  ?Worse with walking ?States taking gabapentin for nerve pain  ?Denies any injury but states "feels like I pulled something"  ?

## 2021-10-16 NOTE — Discharge Instructions (Addendum)
Your x-ray today was without signs of fracture or bony changes in your spine.  You do seem to be experiencing symptoms of sciatica which is when you have that pain radiated on the right side of your leg.  Continue taking Advil and Tylenol and or gabapentin.  I also sent you in some muscle relaxers which may help as well. ? ?If you are sleeping at night, put a pillow either between your knees if you are lying on your side or underneath your knees if you are laying in your back.  I would also recommend walking for about 10 to 15 minutes at a time throughout the day as this can loosen sore muscles and make you feel better.  Feel free to google some stretches for lower back as this can provide some relief as well. ?

## 2021-10-16 NOTE — ED Provider Notes (Signed)
?[facility] EMERGENCY DEPARTMENT ?Provider Note ? ? ?CSN: [id number] ?Arrival date & time: 10/16/21  1517 ? ?  ? ?History ? ?Chief Complaint  ?Patient presents with  ? Back Pain  ? ? ?Leonard Clark is a 25 y.o. male who presents to the ED for evaluation of lower back pain that started about 1 week ago.  Patient denies known injury or trauma.  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  When he is at rest, his pain is minimal, however as soon as he gets up and begins to move he experiences a significant increase in pain, which seems to radiate down his right posterior thigh.  He has been using Advil with temporary relief.  Additionally has a prescription for gabapentin for nerve pain of the left shoulder which also seems to help his pain significantly, although this makes him drowsy and unable to go to work.  Pain is worse with bending, and sitting up from a chair.  He denies saddle paresthesia, bladder and bowel dysfunction.  He has no systemic complaints. ? ? ?Back Pain ? ?  ? ?Home Medications ?Prior to Admission medications   ?Medication Sig Start Date End Date Taking? Authorizing Provider  ?methocarbamol (ROBAXIN) 500 MG tablet Take 1 tablet (500 mg total) by mouth 2 (two) times daily. 10/16/21  Yes Raynald Blend R, PA-C  ?gabapentin (NEURONTIN) 100 MG capsule Take 2 capsules (200 mg total) by mouth 3 (three) times daily. For agitation/neuropathic pain 09/30/20   Armandina Stammer I, NP  ?hydrOXYzine (ATARAX/VISTARIL) 25 MG tablet Take 1 tablet (25 mg total) by mouth every 6 (six) hours as needed for anxiety. 09/29/20   Armandina Stammer I, NP  ?melatonin 3 MG TABS tablet Take 1 tablet (3 mg total) by mouth at bedtime. For sleep 09/30/20   Armandina Stammer I, NP  ?pantoprazole (PROTONIX) 40 MG tablet Take 1 tablet (40 mg total) by mouth daily. For acid reflux 09/30/20   Armandina Stammer I, NP  ?QUEtiapine (SEROQUEL) 50 MG tablet Take 1 tablet (50 mg total) by mouth at bedtime.  For mood control 09/29/20   Armandina Stammer I, NP  ?sertraline (ZOLOFT) 50 MG tablet Take 1 tablet (50 mg total) by mouth at bedtime. For depression 09/29/20   Armandina Stammer I, NP  ?traZODone (DESYREL) 50 MG tablet Take 1 tablet (50 mg total) by mouth at bedtime as needed for sleep. 09/29/20   Sanjuana Kava, NP  ?   ? ?Allergies    ?Red dye and Other   ? ?Review of Systems   ?Review of Systems  ?Musculoskeletal:  Positive for back pain.  ? ?Physical Exam ?Updated Vital Signs ?BP (!) 139/98 (BP Location: Right Arm)   Pulse 85   Temp 98.3 ?F (36.8 ?C) (Oral)   Resp 18   Ht 6\' 1"  (1.854 m)   Wt (!) 149.2 kg   SpO2 97%   BMI 43.41 kg/m?  ?Physical Exam ?Vitals and nursing note reviewed.  ?Constitutional:   ?   General: He is not in acute distress. ?   Appearance: He is obese. He is not ill-appearing.  ?HENT:  ?   Head: Atraumatic.  ?Eyes:  ?   Conjunctiva/sclera: Conjunctivae normal.  ?Cardiovascular:  ?   Rate and Rhythm: Normal rate and regular rhythm.  ?   Pulses: Normal pulses.  ?   Heart sounds: No murmur heard. ?Pulmonary:  ?   Effort: Pulmonary effort  is normal. No respiratory distress.  ?   Breath sounds: Normal breath sounds.  ?Abdominal:  ?   General: Abdomen is flat. There is no distension.  ?   Palpations: Abdomen is soft.  ?   Tenderness: There is no abdominal tenderness. There is no right CVA tenderness or left CVA tenderness.  ?Musculoskeletal:     ?   General: Normal range of motion.  ?   Cervical back: Normal range of motion.  ?     Back: ? ?   Comments: Midline lumbar tenderness, mild right-sided paraspinal tenderness. ? ?Patient has full ROM with trunk flexion and extension, although pain severely worsened on hyperextension.  Ambulatory sensation intact.  ?Skin: ?   General: Skin is warm and dry.  ?   Capillary Refill: Capillary refill takes less than 2 seconds.  ?Neurological:  ?   General: No focal deficit present.  ?   Mental Status: He is alert.  ?Psychiatric:     ?   Mood and Affect: Mood  normal.  ? ? ?ED Results / Procedures / Treatments   ?Labs ?(all labs ordered are listed, but only abnormal results are displayed) ?Labs Reviewed - No data to display ? ?EKG ?None ? ?Radiology ?DG Lumbar Spine Complete ? ?Result Date: 10/16/2021 ?CLINICAL DATA:  Back pain, radicular pain EXAM: LUMBAR SPINE - COMPLETE 5 VIEW COMPARISON:  None Available. FINDINGS: No recent fracture or dislocation is seen. Alignment of posterior margins of vertebral bodies is unremarkable. There is no significant disc space narrowing. Surgical clips are seen in the right upper quadrant. IMPRESSION: No radiographic abnormality is seen in the lumbar spine. Electronically Signed   By: Ernie Avena M.D.   On: 10/16/2021 17:17   ? ?Procedures ?Procedures  ? ? ?Medications Ordered in ED ?Medications  ?ketorolac (TORADOL) 30 MG/ML injection 30 mg (30 mg Intramuscular Given 10/16/21 1624)  ? ? ?ED Course/ Medical Decision Making/ A&P ?  ?                        ?Medical Decision Making ?Amount and/or Complexity of Data Reviewed ?Radiology: ordered. ? ?Risk ?Prescription drug management. ? ? ?Social determinants of health:  ?Social History  ? ?Socioeconomic History  ? Marital status: Single  ?  Spouse name: Not on file  ? Number of children: 0  ? Years of education: Not on file  ? Highest education level: GED or equivalent  ?Occupational History  ? Not on file  ?Tobacco Use  ? Smoking status: Former  ?  Types: Cigarettes  ? Smokeless tobacco: Never  ?Vaping Use  ? Vaping Use: Former  ?Substance and Sexual Activity  ? Alcohol use: Not Currently  ? Drug use: Yes  ?  Types: Marijuana  ?  Comment: not used x 3-4 days  ? Sexual activity: Not Currently  ?Other Topics Concern  ? Not on file  ?Social History Narrative  ? Not on file  ? ?Social Determinants of Health  ? ?Financial Resource Strain: Not on file  ?Food Insecurity: Not on file  ?Transportation Needs: Not on file  ?Physical Activity: Not on file  ?Stress: Not on file  ?Social  Connections: Not on file  ?Intimate Partner Violence: Not on file  ? ? ? ?Initial impression: ? ?This patient presents to the ED for concern of lower back pain, this involves an extensive number of treatment options, and is a complaint that carries with it a high risk of  complications and morbidity.   Differentials include herniated disc, kidney stone, muscular strain, vertebral fracture, cauda equina.  ? ?Comorbidities affecting care:  ?Morbid obesity ? ? ?Imaging Studies ordered: ? ?I ordered imaging studies including  ?X-ray of lumbar spine without evidence of fracture or acute findings ?I independently visualized and interpreted imaging and I agree with the radiologist interpretation.  ? ? ?Medicines ordered and prescription drug management: ? ?I ordered medication including: ?Toradol 30 mg IM ?Reevaluation of the patient after these medicines showed that the patient stayed the same ?I have reviewed the patients home medicines and have made adjustments as needed ? ? ?ED Course/Re-evaluation: ?26 year old male in no acute distress, nontoxic-appearing presents to the ED for evaluation of nontraumatic lower back pain.  Vitals without acute abnormalities.  Patient is ambulatory.  He does have some midline lower lumbar tenderness and some right-sided paraspinal tenderness.  Pain worsened on hyperextension.  No CVA tenderness.  He was given 30 mg Toradol IM without really any change in symptoms.  I obtained and viewed x-ray of the lumbar spine with no acute findings.  I agree with radiologist interpretation.  Patient's exam not consistent with cauda equina.  Patient symptoms likely muscular in origin.  It is possible that he has a herniated disc, not visible on x-ray, however I did not obtain CT scan as this would not ultimately change his treatment.  Discussed at home stretching, frequent walks and Tylenol Motrin for pain.  He does have some relief with at home gabapentin for another condition.  Also offered muscle  relaxers for muscular stiffness.  Patient expresses understanding and is amenable to plan.  Discharged home in good condition. ? ?Disposition: ? ?After consideration of the diagnostic results, physical exam, history and the pat

## 2023-10-26 IMAGING — CR DG LUMBAR SPINE COMPLETE 4+V
5 series · 5 of 5 positions shown · non-contrast
Comparison: None Available.

CLINICAL DATA: Back pain, radicular pain

EXAM:
LUMBAR SPINE - COMPLETE 5 VIEW

[t l-spine a.p.]
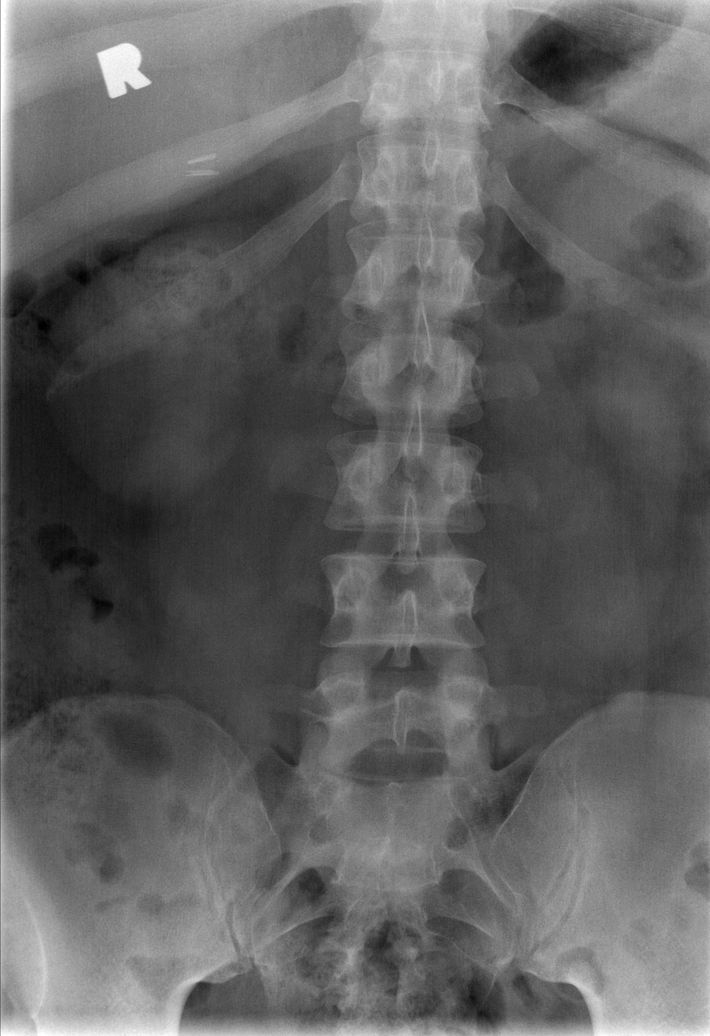

[t l-spine oblique exposure (1 of 2)]
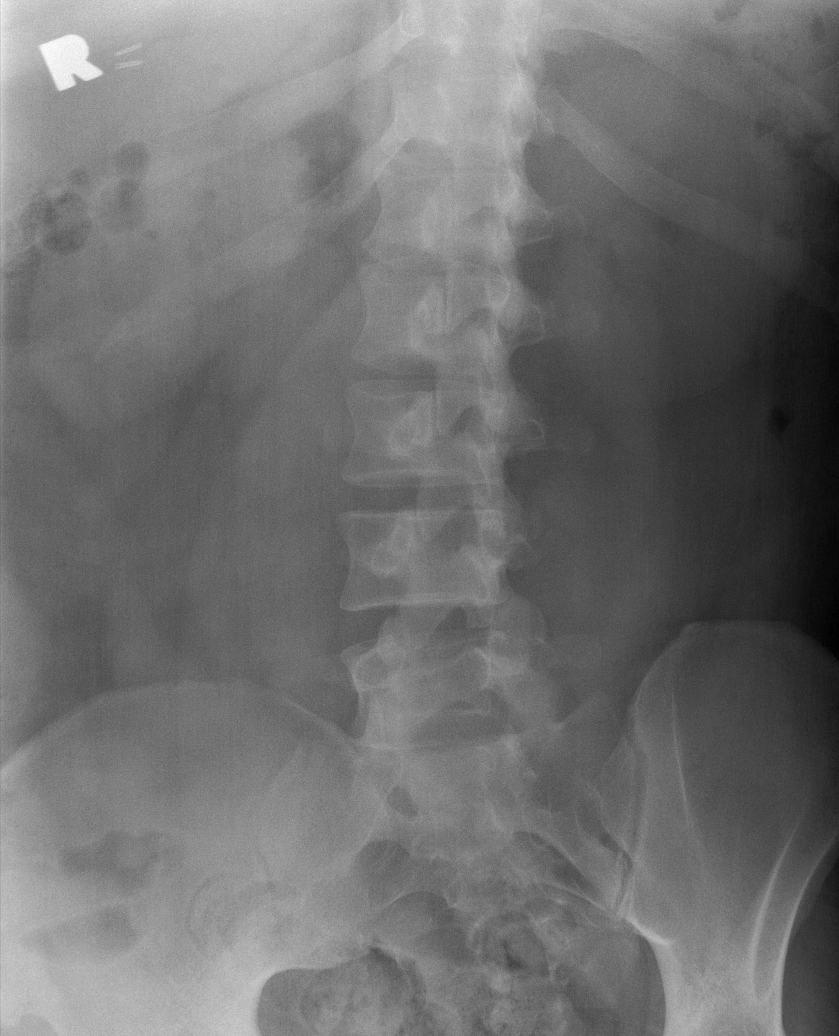

[t l-spine oblique exposure (2 of 2)]
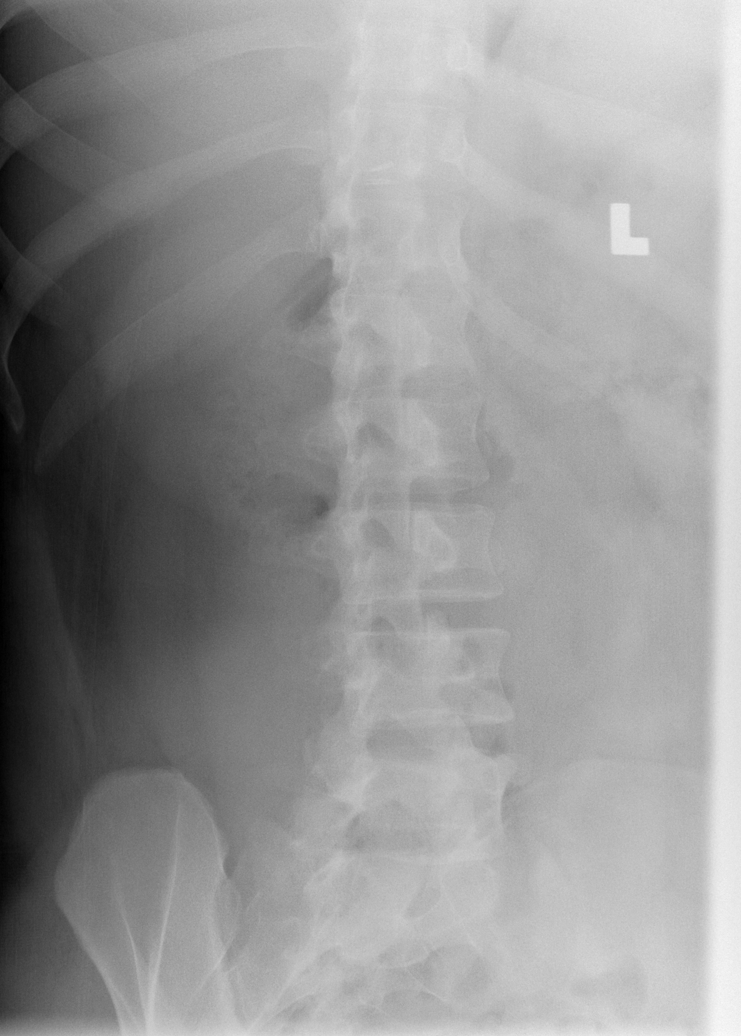

[t l-spine lat]
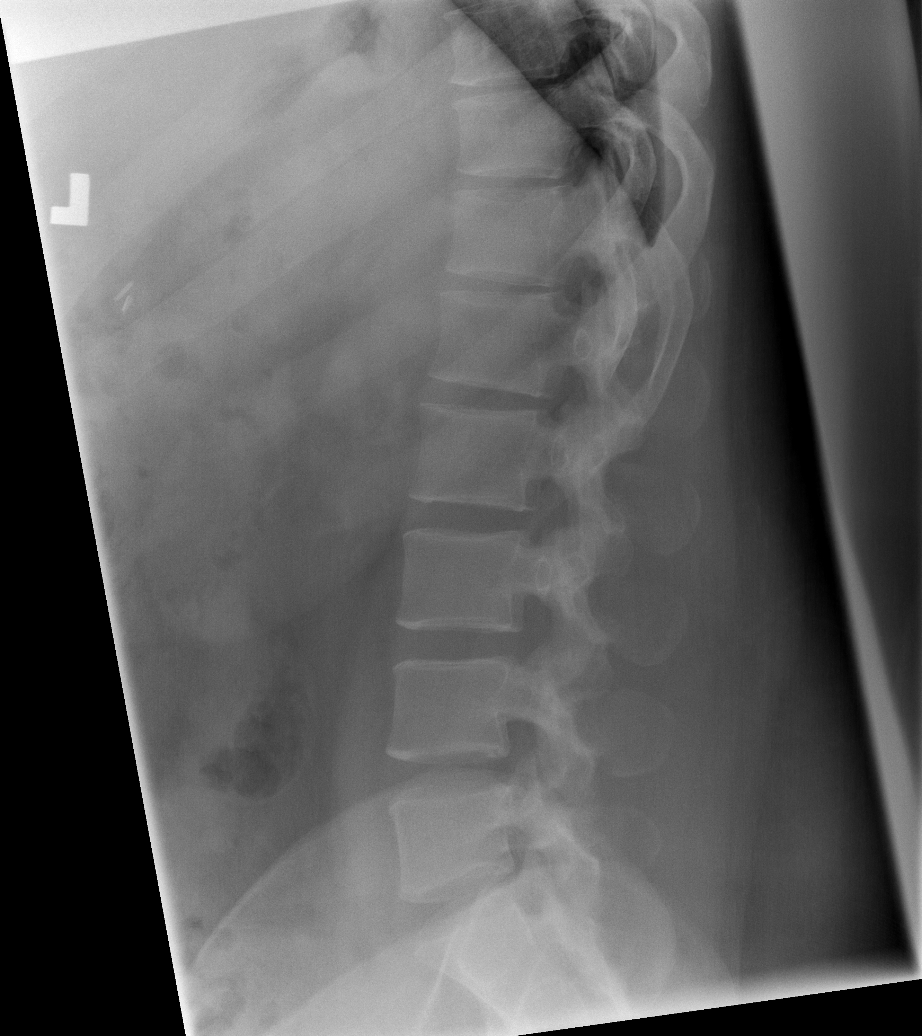

[t l-spine l5-s1 spot]
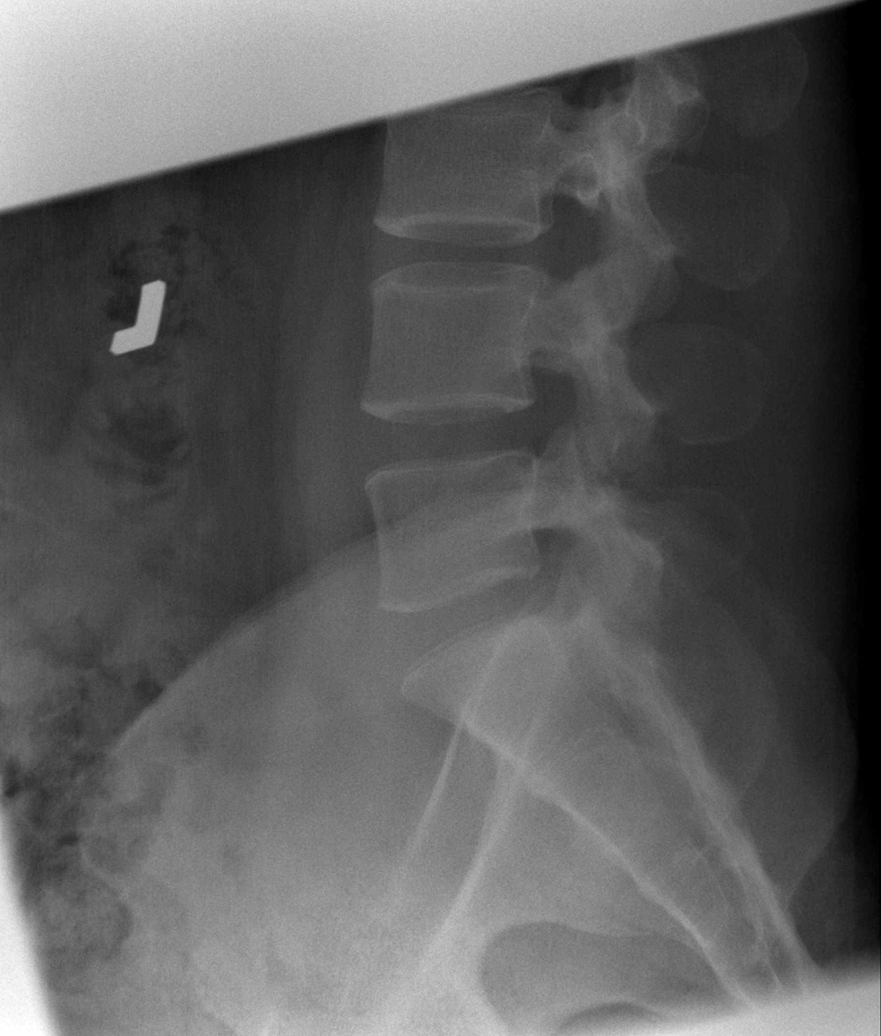

[5 of 5 positions shown; findings below may reference images not displayed]

FINDINGS: No recent fracture or dislocation is seen. Alignment of posterior
margins of vertebral bodies is unremarkable. There is no significant
disc space narrowing. Surgical clips are seen in the right upper
quadrant.
IMPRESSION: No radiographic abnormality is seen in the lumbar spine.
